# Patient Record
Sex: Male | Born: 1995 | Race: White | Hispanic: Yes | Marital: Single | Smoking: Never smoker
Health system: Southern US, Community
[De-identification: ages and names within clinical notes are randomized; demographics above are authoritative.]

## PROBLEM LIST (undated history)

## (undated) DIAGNOSIS — E049 Nontoxic goiter, unspecified: Secondary | ICD-10-CM

## (undated) DIAGNOSIS — N62 Hypertrophy of breast: Secondary | ICD-10-CM

## (undated) DIAGNOSIS — E063 Autoimmune thyroiditis: Secondary | ICD-10-CM

## (undated) DIAGNOSIS — R6250 Unspecified lack of expected normal physiological development in childhood: Secondary | ICD-10-CM

## (undated) DIAGNOSIS — E300 Delayed puberty: Secondary | ICD-10-CM

## (undated) DIAGNOSIS — R5600 Simple febrile convulsions: Secondary | ICD-10-CM

## (undated) HISTORY — DX: Nontoxic goiter, unspecified: E04.9

## (undated) HISTORY — DX: Simple febrile convulsions: R56.00

## (undated) HISTORY — PX: OTHER SURGICAL HISTORY: SHX169

## (undated) HISTORY — DX: Hypertrophy of breast: N62

## (undated) HISTORY — DX: Delayed puberty: E30.0

## (undated) HISTORY — DX: Autoimmune thyroiditis: E06.3

## (undated) HISTORY — DX: Unspecified lack of expected normal physiological development in childhood: R62.50

---
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
## 2015-02-03 ENCOUNTER — Other Ambulatory Visit: Payer: Self-pay | Admitting: "Endocrinology

## 2015-03-21 ENCOUNTER — Other Ambulatory Visit: Payer: Self-pay | Admitting: "Endocrinology

## 2015-05-06 ENCOUNTER — Ambulatory Visit (INDEPENDENT_AMBULATORY_CARE_PROVIDER_SITE_OTHER): Payer: Medicaid Other | Admitting: "Endocrinology

## 2015-05-06 ENCOUNTER — Encounter: Payer: Self-pay | Admitting: "Endocrinology

## 2015-05-06 VITALS — BP 106/64 | HR 60 | Wt 109.0 lb

## 2015-05-06 DIAGNOSIS — E049 Nontoxic goiter, unspecified: Secondary | ICD-10-CM | POA: Diagnosis not present

## 2015-05-06 DIAGNOSIS — R634 Abnormal weight loss: Secondary | ICD-10-CM

## 2015-05-06 DIAGNOSIS — E038 Other specified hypothyroidism: Secondary | ICD-10-CM | POA: Diagnosis not present

## 2015-05-06 DIAGNOSIS — E063 Autoimmune thyroiditis: Secondary | ICD-10-CM

## 2015-05-06 NOTE — Progress Notes (Signed)
Subjective:  Patient Name: [name] [name] Date of Birth: [birth year]  MRN: [id number]  [name] [name]  presents to the office today for follow-up evaluation and management of his acquired hypothyroidism, goiter, and thyroiditis.   HISTORY OF PRESENT ILLNESS:   [name] [name] is a 19 y.o. Hispanic young man.  [name] [name] was unaccompanied.  1. I saw the patient for his initial consultation on 02/26/10. He had been referred by his nurse practitioner at [facility], [name], for evaluation of growth delay, puberty delay, and gynecomastia. He was 14-5/40 years old.  A. The child had had febrile seizures at about 44 months of age and was treated for two years with some medications, presumably anti-epileptic drugs. He was subsequently noted to have short stature and was evaluated at the [facility] in 2009. His screening tests for growth hormone were reportedly normal. Bone age was reportedly read as 70 at a chronologic age of 12-3/12.  Child was supposed to FU at [facility] in 2010 but did not. Gynecomastia was first noted in 2010. FH was positive for the father having pubertal delay and continuing to grow at ages 75-18. Father was reportedly 72 inches tall. Mother was 60 inches tall. Maternal grandfather was tall, but maternal grandmother and both paternal grandparents were short.    B. On physical exam the child was at about the 6% for height and at about the 8% for weight, increased from the 2% and 1% respectively in 2009. He was short and slender, but proportionate. He had a 20+ gram goiter. Areolae were Tanner stage 1 in configuration, but 19-20 mm in diameter, which was somewhat enlarged. Pubic hair was Tanner III-IV, Testes were 16-20 mL in volume. IGF-1 was 203 (normal for age and increased from 50 in 2009). CMP was normal. TFTs were low-normal. 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  He also had a goiter  and low-normal TFTs. I elected to follow him serially over time.   2. During the past five years he has progressed further through puberty and has grown in both height and weight. His goiter size and TFTs have fluctuated over time. When his TFTs in July 2013 were hypothyroid, I started him on Synthroid, 25 mcg/day.   3. The patient's last [facility] visit was on 11/07/14. At that visit Ms. Maxwell Caul, our NP,discussed the possibilities of increasing his Synthroid dose over time if needed or stopping the Synthroid for a trial period. Ms. Maxwell Caul and Dougles decided to continue the Synthroid dose of 25 mcg/day. In the interim he has been healthy. He has been taking his Synthroid every day. He eats well. He gets some exercise going to the gym. He forgot to have lab work done before this visit.   4. Pertinent Review of Systems:  Constitutional: [name] [name] feels "okay". His energy level and stamina are good.  Eyes: Vision seems to be good. There are no recognized eye problems. Neck: The patient has no complaints of anterior neck swelling, soreness, tenderness, pressure, discomfort, or difficulty swallowing.   Heart: He feels that his heart beats faster at times when he exercises. The patient has no complaints of palpitations, irregular heart beats, chest pain, or chest pressure.   Gastrointestinal: Bowel movents seem normal. The patient has no complaints of excessive hunger, acid reflux, upset stomach, stomach aches or pains, diarrhea, or constipation.  Hands: No tremor Legs: Muscle mass and strength seem normal. There are no complaints  of numbness, tingling, burning, or pain. No edema is noted.  Feet: There are no obvious foot problems. There are no complaints of numbness, tingling, burning, or pain. No edema is noted. Neurologic: There are no recognized problems with muscle movement and strength, sensation, or coordination. He can do his video games and text quite well.  Psychologic: He is "very good". Mentally: He  does well in terms of paying attention, remembering, thinking, and making decisions.  PAST MEDICAL, FAMILY, AND SOCIAL HISTORY  Past Medical History  Diagnosis Date  . Febrile seizure   . Physical growth delay   . Gynecomastia, male   . Puberty delay   . Goiter   . Thyroiditis, autoimmune     Family History  Problem Relation Age of Onset  . Diabetes Paternal Grandfather   . Cancer Neg Hx   . Heart disease Neg Hx      Current outpatient prescriptions:  .  SYNTHROID 25 MCG tablet, TAKE ONE TABLET BY MOUTH IN THE MORNING, Disp: 30 tablet, Rfl: 0  Allergies as of 05/06/2015  . (No Known Allergies)     reports that he has never smoked. He has never used smokeless tobacco. He reports that he does not drink alcohol or use illicit drugs. Pediatric History  Patient Guardian Status  . Mother:  Chaudhary,[name]   Other Topics Concern  . Not on file   Social History Narrative    1. School and Family: He graduated from high school this past May. 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 2. Activities: He goes to the gym three days per week. He has health insurance through his parents.     3. Primary Care Provider: None  REVIEW OF SYSTEMS: There are no other significant problems involving [name] [name]'s other body systems.   Objective:  Vital Signs:  BP 106/64 mmHg  Pulse 60  Wt 109 lb (49.442 kg)   Ht Readings from Last 3 Encounters:  10/30/13 5' 4.45" (1.637 m) (4 %*, Z = -1.73)  05/02/13 5' 3.9" (1.623 m) (3 %*, Z = -1.86)  01/30/13 5' 4.09" (1.628 m) (4 %*, Z = -1.75)   * Growth percentiles are based on CDC 2-20 Years data.   Wt Readings from Last 3 Encounters:  05/06/15 109 lb (49.442 kg) (0 %*, Z = -2.59)  10/30/14 110 lb 3.2 oz (49.986 kg) (1 %*, Z = -2.43)  05/02/14 109 lb (49.442 kg) (1 %*, Z = -2.42)   * Growth percentiles are based on CDC 2-20 Years data.   There is no height on file to calculate BSA. No height on file for this encounter. 0%ile  (Z=-2.59) based on CDC 2-20 Years weight-for-age data using vitals from 05/06/2015.  PHYSICAL EXAM:  Constitutional: The patient appears healthy, trim, fit, and slender. His weight has decreased one pound during this very hot Summer. He is quiet but conversational when asked questions.  Head: The head is normocephalic. Face: The face appears normal. There are no obvious dysmorphic features. Eyes: There is no obvious arcus or proptosis. Moisture appears normal. Mouth: The oropharynx and tongue appear normal. Dentition appears to be normal for age. Oral moisture is normal. Neck: The neck appears to be visibly normal. No carotid bruits are noted. His strap muscles are much thicker, c/w weight lifting and physical work. The thyroid gland is enlarged today, about 22-23 grams in size. His left lobe is larger than the right. . The thyroid gland is not tender to palpation. Lungs:  The lungs are clear to auscultation. Air movement is good. Heart: Heart rate and rhythm are regular. Heart sounds S1 and S2 are normal. I did not appreciate any pathologic cardiac murmurs. Abdomen: The abdomen is normal in size. Bowel sounds are normal. There is no obvious hepatomegaly, splenomegaly, or other mass effect.  Arms: Muscle size and bulk are normal for age. Hands: There is no obvious tremor. Phalangeal and metacarpophalangeal joints are normal. Palmar muscles are normal for age. Palmar skin is normal. Palmar moisture is also normal. Legs: Muscles appear normal for age. No edema is present. Neurologic: Strength is normal for age in both the upper and lower extremities. Muscle tone is normal. Sensation to touch is normal in both legs.    LAB DATA:   Results for orders placed or performed in visit on 05/08/14  TSH  Result Value Ref Range   TSH 1.307 0.350 - 4.500 uIU/mL  T4, free  Result Value Ref Range   Free T4 1.28 0.80 - 1.80 ng/dL  T3, free  Result Value Ref Range   T3, Free 3.9 2.3 - 4.2 pg/mL   10/30/14:  TSH 1.307, free T4 1.28, free T3 3.9; 05/02/14: TSH 1.244, free T4 1.21, free T3 3.7 10/30/13: TSH 2.407, free T4 1.24, free T3 3.3; CBC normal; CMP normal 05/02/13: TSH 1.619, free T4 1.18, free T3 3.6 01/30/13: TSH 2.004, free T4 1.28, free T3 3.9 07/13/12: TSH 1.809, free T4 1.15, free T3 3.5, CMP normal, IGF-1 355, testosterone 286.28 03/29/12: TSH 5.393, free T4 0.98, free T3 3.3, IGF-1 265 01/11/12: TSH 3.81, free T4 0.94, free T3 3.8, TPO <10; LH 1.4, FSH 6.9, testosterone 198.57,    Assessment and Plan:   ASSESSMENT:  1. Goiter: His thyroid is more enlarged in size today. The process of waxing and waning of thyroid gland size is c/w evolving Hashimoto's thyroiditis.  2. Hypothyroidism, acquired, autoimmune: His TFTs in March were normal. :He has not had his labs done yet before this visit. We will assess whether he needs an increase in his Synthroid dose.   3. Hashimoto's thyroiditis: His thyroiditis is clinically quiescent today. However, the fact that all three of his TFTS increased in parallel from September 2015 to March 2016 indicates that he had had a flare up of thyroiditis during that time period. The shift of all three TFTs together, increasing or decreasing in parallel, is pathognomonic for an interval flare up of Hashimoto's disease.  4. 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    PLAN:  1. Diagnostic: TFTs today. Repeat TFTs in 6 months.  2. Therapeutic: Increase Synthroid when needed.  3. Patient education: Discussed the fact that he has episodic flare ups of thyroiditis and will likely need an increase in his Synthroid dose within the next 1-2 years. Discussed continuing Synthroid and needing to get labs today. He is happy and healthy and had no other concerns today.  4. Follow-up: 6 months   Level of Service: This visit lasted in excess of 35 minutes. More than 50% of the visit was  devoted to counseling.  David Stall MD, CDE

## 2015-05-06 NOTE — Patient Instructions (Signed)
Follow up visit in 6 months. Please repeat lab tests this week and again one week prior to next visit.  

## 2015-05-13 ENCOUNTER — Other Ambulatory Visit: Payer: Self-pay | Admitting: "Endocrinology

## 2015-07-17 ENCOUNTER — Other Ambulatory Visit: Payer: Self-pay | Admitting: "Endocrinology

## 2015-08-15 ENCOUNTER — Ambulatory Visit (INDEPENDENT_AMBULATORY_CARE_PROVIDER_SITE_OTHER): Payer: Self-pay | Admitting: Family Medicine

## 2015-08-15 ENCOUNTER — Ambulatory Visit (INDEPENDENT_AMBULATORY_CARE_PROVIDER_SITE_OTHER): Payer: Self-pay

## 2015-08-15 VITALS — BP 128/79 | HR 81 | Temp 97.7°F | Resp 16 | Ht 64.0 in | Wt 109.8 lb

## 2015-08-15 DIAGNOSIS — R079 Chest pain, unspecified: Secondary | ICD-10-CM

## 2015-08-15 DIAGNOSIS — T148XXA Other injury of unspecified body region, initial encounter: Secondary | ICD-10-CM

## 2015-08-15 NOTE — Progress Notes (Signed)
[facility] [address] [place] [address] [phone]  Date:  08/15/2015   Name:  [name] [name]   DOB:  23-Aug-1996   MRN:  [id number]  PCP:  No PCP Per Patient    Chief Complaint: Chest Pain   History of Present Illness:  This is a 19 y.o. male with PMH hypothyroidism who is presenting with chest pain x 5 days. States it develops as the day goes on and goes away usually by night time. CP is either located midsternal, left anterior chest or left lateral chest. Worse with movement. Sometimes hurts with breathing in cold air. He denies sob, dizziness, diaphoresis, vision change, headache. Sometimes gets fast heart beats, esp when he starts to get worried about the cp. He is not currently having any pain. He states this happened before 3 months ago but went away on own. He is unsure if he was seen for this issue ? He has no hx of acid reflux. No assoc between cp and eating. No hx anxiety. He states he does not feel stressed. He did take something for his pain which helped but he has no idea what he took. He has been taking synthroid daily. He is followed by endocrinology, last saw 05/2015. He has no hx heart disease. Only heart disease in his family with MGM - does not have any information otherwise. She was >50 when she started having problems with her heart.  He is a Education administrator.  Review of Systems:  Review of Systems See HPI  Patient Active Problem List   Diagnosis Date Noted  . Hypothyroidism, acquired, autoimmune 03/29/2012  . Weight loss 03/29/2012  . Goiter   . Thyroiditis, autoimmune     Prior to Admission medications   Medication Sig Start Date End Date Taking? Authorizing Provider  SYNTHROID 25 MCG tablet TAKE ONE TABLET BY MOUTH IN THE MORNING 07/17/15  Yes David Stall, MD    No Known Allergies  Past Surgical History  Procedure Laterality Date  . None      Social History  Substance Use Topics  . Smoking status: Never Smoker   .  Smokeless tobacco: Never Used  . Alcohol Use: No    Family History  Problem Relation Age of Onset  . Diabetes Paternal Grandfather   . Cancer Neg Hx   . Heart disease Neg Hx     Medication list has been reviewed and updated.  Physical Examination:  Physical Exam  Constitutional: He is oriented to person, place, and time. He appears well-developed and well-nourished. No distress.  HENT:  Head: Normocephalic and atraumatic.  Right Ear: Hearing, tympanic membrane, external ear and ear canal normal.  Left Ear: Hearing, tympanic membrane, external ear and ear canal normal.  Nose: Nose normal.  Mouth/Throat: Uvula is midline and mucous membranes are normal. Posterior oropharyngeal erythema present. No oropharyngeal exudate or posterior oropharyngeal edema.  Eyes: Conjunctivae, EOM and lids are normal. Pupils are equal, round, and reactive to light. Right eye exhibits no discharge. Left eye exhibits no discharge. No scleral icterus.  Neck: Trachea normal. Carotid bruit is not present. No thyromegaly present.  Cardiovascular: Normal rate, regular rhythm, normal heart sounds and normal pulses.   No murmur heard. Pulmonary/Chest: Effort normal and breath sounds normal. No respiratory distress. He has no wheezes. He has no rhonchi. He has no rales. He exhibits tenderness.  Tenderness mid-sternal No pain with rotation of trunk or flexion/extension of arms.  Abdominal: Soft. Normal appearance.  There is no tenderness.  No epigastric tenderness  Musculoskeletal: Normal range of motion.  Lymphadenopathy:       Head (right side): No submental, no submandibular and no tonsillar adenopathy present.       Head (left side): No submental, no submandibular and no tonsillar adenopathy present.    He has no cervical adenopathy.  Neurological: He is alert and oriented to person, place, and time.  Skin: Skin is warm, dry and intact. No lesion and no rash noted.  Psychiatric: His speech is normal and  behavior is normal. Thought content normal.  Appears anxious   BP 128/79 mmHg  Pulse 81  Temp(Src) 97.7 F (36.5 C) (Oral)  Resp 16  Ht 5\' 4"  (1.626 m)  Wt 109 lb 12.8 oz (49.805 kg)  BMI 18.84 kg/m2  SpO2 98%  EKG interpreted with Dr. Patsy Lageropland: sinus rhythm with possible right atrial enlargement  [facility] reading (PRIMARY) by  Dr. Patsy Lageropland: negative. IMPRESSION: No active disease.  CHEST - 2 VIEW  COMPARISON: None.  FINDINGS: Cardiac shadow is within normal limits. The lungs are well aerated bilaterally. No focal infiltrate, effusion or pneumothorax is noted. Mild scoliosis is seen in the thoracolumbar spine.  IMPRESSION: No active disease  Assessment and Plan:  1. Chest pain, unspecified chest pain type 2. Muscle strain EKG without worrisome findings. CXR nml. This is likely a muscle strain. He will take ibuprofen 600 mg TID x 3-4 days. Counseled on heat and activity modification. If pain is not subsiding in 1 week he will let me know and I will refer to cardiology. - EKG 12-Lead - DG Chest 2 View; Future  Discussed case with Dr. Patsy Lageropland.    Roswell MinersNicole V. Dyke BrackettBush, PA-C, MHS [facility]  08/15/2015   Agree with above- I was able to reproduce pts CP by pressing on the left costochondral area.  He also endorses that he has the pain when he moves or stretches his trunk in a certain way.  This certainly seems to be non- cardiac pain.  Cautioned pt to seek care again if his sx return

## 2015-08-15 NOTE — Patient Instructions (Signed)
You can take 3 ibuprofen every 8 hours for the next 3-4 days for your chest pain. Heat can help. Modify your activity for a few days if able. If you are still having problems in a week, let me know, and I will refer you to a heart doctor.  Muscle Strain A muscle strain is an injury that occurs when a muscle is stretched beyond its normal length. Usually a small number of muscle fibers are torn when this happens. Muscle strain is rated in degrees. First-degree strains have the least amount of muscle fiber tearing and pain. Second-degree and third-degree strains have increasingly more tearing and pain.  Usually, recovery from muscle strain takes 1-2 weeks. Complete healing takes 5-6 weeks.  CAUSES  Muscle strain happens when a sudden, violent force placed on a muscle stretches it too far. This may occur with lifting, sports, or a fall.  RISK FACTORS Muscle strain is especially common in athletes.  SIGNS AND SYMPTOMS At the site of the muscle strain, there may be:  Pain.  Bruising.  Swelling.  Difficulty using the muscle due to pain or lack of normal function. DIAGNOSIS  Your health care provider will perform a physical exam and ask about your medical history. TREATMENT  Often, the best treatment for a muscle strain is resting, icing, and applying cold compresses to the injured area.  HOME CARE INSTRUCTIONS   Use the PRICE method of treatment to promote muscle healing during the first 2-3 days after your injury. The PRICE method involves:  Protecting the muscle from being injured again.  Restricting your activity and resting the injured body part.  Icing your injury. To do this, put ice in a plastic bag. Place a towel between your skin and the bag. Then, apply the ice and leave it on from 15-20 minutes each hour. After the third day, switch to moist heat packs.  Apply compression to the injured area with a splint or elastic bandage. Be careful not to wrap it too tightly. This may  interfere with blood circulation or increase swelling.  Elevate the injured body part above the level of your heart as often as you can.  Only take over-the-counter or prescription medicines for pain, discomfort, or fever as directed by your health care provider.  Warming up prior to exercise helps to prevent future muscle strains. SEEK MEDICAL CARE IF:   You have increasing pain or swelling in the injured area.  You have numbness, tingling, or a significant loss of strength in the injured area. MAKE SURE YOU:   Understand these instructions.  Will watch your condition.  Will get help right away if you are not doing well or get worse.   This information is not intended to replace advice given to you by your health care provider. Make sure you discuss any questions you have with your health care provider.   Document Released: 08/16/2005 Document Revised: 06/06/2013 Document Reviewed: 03/15/2013 Elsevier Interactive Patient Education [employer]2016 Elsevier Inc.

## 2015-08-29 ENCOUNTER — Emergency Department (HOSPITAL_COMMUNITY)
Admission: EM | Admit: 2015-08-29 | Discharge: 2015-08-29 | Disposition: A | Discharge status: Home / Self Care | Payer: Medicaid Other | Attending: Emergency Medicine | Admitting: Emergency Medicine

## 2015-08-29 ENCOUNTER — Emergency Department (HOSPITAL_COMMUNITY): Payer: Medicaid Other

## 2015-08-29 ENCOUNTER — Encounter (HOSPITAL_COMMUNITY): Payer: Self-pay | Admitting: *Deleted

## 2015-08-29 DIAGNOSIS — Y990 Civilian activity done for income or pay: Secondary | ICD-10-CM | POA: Diagnosis not present

## 2015-08-29 DIAGNOSIS — E063 Autoimmune thyroiditis: Secondary | ICD-10-CM | POA: Insufficient documentation

## 2015-08-29 DIAGNOSIS — S29011A Strain of muscle and tendon of front wall of thorax, initial encounter: Secondary | ICD-10-CM

## 2015-08-29 DIAGNOSIS — Z87438 Personal history of other diseases of male genital organs: Secondary | ICD-10-CM | POA: Insufficient documentation

## 2015-08-29 DIAGNOSIS — Y9289 Other specified places as the place of occurrence of the external cause: Secondary | ICD-10-CM | POA: Diagnosis not present

## 2015-08-29 DIAGNOSIS — X58XXXA Exposure to other specified factors, initial encounter: Secondary | ICD-10-CM | POA: Diagnosis not present

## 2015-08-29 DIAGNOSIS — R079 Chest pain, unspecified: Secondary | ICD-10-CM | POA: Diagnosis present

## 2015-08-29 DIAGNOSIS — Y9389 Activity, other specified: Secondary | ICD-10-CM | POA: Diagnosis not present

## 2015-08-29 LAB — I-STAT TROPONIN, ED: Troponin i, poc: 0 ng/mL (ref 0.00–0.08)

## 2015-08-29 LAB — I-STAT CHEM 8, ED
BUN: 12 mg/dL (ref 6–20)
CALCIUM ION: 1.15 mmol/L (ref 1.12–1.23)
CHLORIDE: 103 mmol/L (ref 101–111)
CREATININE: 0.6 mg/dL — AB (ref 0.61–1.24)
GLUCOSE: 94 mg/dL (ref 65–99)
HCT: 51 % (ref 39.0–52.0)
Hemoglobin: 17.3 g/dL — ABNORMAL HIGH (ref 13.0–17.0)
POTASSIUM: 3.3 mmol/L — AB (ref 3.5–5.1)
Sodium: 142 mmol/L (ref 135–145)
TCO2: 26 mmol/L (ref 0–100)

## 2015-08-29 MED ORDER — IBUPROFEN 800 MG PO TABS
800.0000 mg | ORAL_TABLET | Freq: Once | ORAL | Status: AC
  Administered 2015-08-29: 800 mg via ORAL
  Filled 2015-08-29: qty 1

## 2015-08-29 NOTE — ED Provider Notes (Signed)
CSN: [id number]     Arrival date & time 08/29/15  1728 History   First MD Initiated Contact with Patient 08/29/15 2152     Chief Complaint  Patient presents with  . Chest Pain     (Consider location/radiation/quality/duration/timing/severity/associated sxs/prior Treatment) HPI   Blood pressure 127/71, pulse 78, temperature 98.8 F (37.1 C), resp. rate 19, height  (1.651 m), SpO2 100 %.  Reznor Ferrando is a 19 y.o. 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 He is right-hand-dominant. He states that the pain improves when he takes several days off of work but it is exacerbated after a day of painting. He rates his pain at 6 out of 10, he is taking an unknown pain medication at home with some relief. He denies family history of early cardiac death, cough, fever, chills, history of DVT or PE, recent immobilization, calf pain or leg swelling, history of cancer.   Past Medical History  Diagnosis Date  . Febrile seizure (HCC)   . Physical growth delay   . Gynecomastia, male   . Puberty delay   . Goiter   . Thyroiditis, autoimmune    Past Surgical History  Procedure Laterality Date  . None     Family History  Problem Relation Age of Onset  . Diabetes Paternal Grandfather   . Cancer Neg Hx   . Heart disease Neg Hx    Social History  Substance Use Topics  . Smoking status: Never Smoker   . Smokeless tobacco: Never Used  . Alcohol Use: No    Review of Systems  10 systems reviewed and found to be negative, except as noted in the HPI.   Allergies  Review of patient's allergies indicates no known allergies.  Home Medications   Prior to Admission medications   Medication Sig Start Date End Date Taking? Authorizing Provider  SYNTHROID 25 MCG tablet TAKE ONE TABLET BY MOUTH IN THE MORNING 07/17/15  Yes David Stall, MD   BP 127/71 mmHg  Pulse 78   Temp(Src) 98.8 F (37.1 C)  Resp 19  Ht  (1.651 m)  SpO2 100% Physical Exam  Constitutional: He is oriented to person, place, and time. He appears well-developed and well-nourished. No distress.  HENT:  Head: Normocephalic.  Mouth/Throat: Oropharynx is clear and moist.  Eyes: Conjunctivae and EOM are normal.  Neck: Normal range of motion. No JVD present. No tracheal deviation present.  Cardiovascular: Normal rate, regular rhythm and intact distal pulses.   Radial pulse equal bilaterally  Pulmonary/Chest: Effort normal and breath sounds normal. No stridor. No respiratory distress. He has no wheezes. He has no rales. He exhibits tenderness.  Abdominal: Soft. He exhibits no distension and no mass. There is no tenderness. There is no rebound and no guarding.  Musculoskeletal: Normal range of motion. He exhibits no edema or tenderness.  No calf asymmetry, superficial collaterals, palpable cords, edema, Homans sign negative bilaterally.    Neurological: He is alert and oriented to person, place, and time.  Skin: Skin is warm. He is not diaphoretic.  Psychiatric: He has a normal mood and affect.  Nursing note and vitals reviewed.   ED Course  Procedures (including critical care time) Labs Review Labs Reviewed  Rosezena Sensor, ED  I-STAT CHEM 8, ED    Imaging Review Dg Chest 2 View  08/29/2015  CLINICAL DATA:  19 year old male with chest  pain upon deep breathing EXAM: CHEST  2 VIEW COMPARISON:  Radiograph dated 08/15/2015 FINDINGS: The heart size and mediastinal contours are within normal limits. Both lungs are clear. The visualized skeletal structures are unremarkable. IMPRESSION: No active cardiopulmonary disease. Electronically Signed   By: Elgie CollardArash  Radparvar M.D.   On: 08/29/2015 18:23   I have personally reviewed and evaluated these images and lab results as part of my medical decision-making.   EKG Interpretation None      MDM   Final diagnoses:  Chest wall muscle  strain, initial encounter    Filed Vitals:   08/29/15 2207 08/29/15 2215 08/29/15 2230 08/29/15 2245  BP:  134/74 129/75 121/69  Pulse:  79 93 77  Temp:      Resp:  13 18 26   Height:      SpO2: 100% 100% 100% 100%    Medications  ibuprofen (ADVIL,MOTRIN) tablet 800 mg (800 mg Oral Given 08/29/15 2248)    Herb Grayssmael Kittle is 19 y.o. male presenting with reproducible chest pain, exacerbated by movement. 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 EKG with nonspecific changes, no prior for comparison. Troponin and i-STAT Chem-8 without significant abnormality. Discussed EKG with attending who agrees patient is stable for discharge, given outpatient follow-up to establish primary care.  Evaluation does not show pathology that would require ongoing emergent intervention or inpatient treatment. Pt is hemodynamically stable and mentating appropriately. Discussed findings and plan with patient/guardian, who agrees with care plan. All questions answered. Return precautions discussed and outpatient follow up given.       Wynetta Emeryicole Jeronimo Hellberg, PA-C 08/30/15 0011  Doug SouSam Jacubowitz, MD 08/30/15 [phone]

## 2015-08-29 NOTE — Discharge Instructions (Signed)
For pain control please take ibuprofen (also known as Motrin or Advil) 800mg (this is normally 4 over the counter pills) 3 times a day  for 5 days. Take with food to minimize stomach irritation. ° °Do not hesitate to return to the emergency room for any new, worsening or concerning symptoms. ° °Please obtain primary care using resource guide below. Let them know that you were seen in the emergency room and that they will need to obtain records for further outpatient management. ° ° ° °Emergency Department Resource Guide °1) Find a Doctor and Pay Out of Pocket °Although you won't have to find out who is covered by your insurance plan, it is a good idea to ask around and get recommendations. You will then need to call the office and see if the doctor you have chosen will accept you as a new patient and what types of options they offer for patients who are self-pay. Some doctors offer discounts or will set up payment plans for their patients who do not have insurance, but you will need to ask so you aren't surprised when you get to your appointment. ° °2) Contact Your Local Health Department °Not all health departments have doctors that can see patients for sick visits, but many do, so it is worth a call to see if yours does. If you don't know where your local health department is, you can check in your phone book. The CDC also has a tool to help you locate your state's health department, and many state websites also have listings of all of their local health departments. ° °3) Find a Walk-in Clinic °If your illness is not likely to be very severe or complicated, you may want to try a walk in clinic. These are popping up all over the country in pharmacies, drugstores, and shopping centers. They're usually staffed by nurse practitioners or physician assistants that have been trained to treat common illnesses and complaints. They're usually fairly quick and inexpensive. However, if you have serious medical issues or  chronic medical problems, these are probably not your best option. ° °No Primary Care Doctor: °- Call [facility] at  832-8000 - they can help you locate a primary care doctor that  accepts your insurance, provides certain services, etc. °- Physician Referral Service- 1-800-533-3463 ° °Chronic Pain Problems: °Organization         Address  Phone   Notes  °[facility]  (336) 297-2271 Patients need to be referred by their primary care doctor.  ° °Medication Assistance: °Organization         Address  Phone   Notes  °[place] [facility] [address] °[place], [place] [address] (336) 641-8030 --Must be a resident of [place] °-- Must have NO insurance coverage whatsoever (no Medicaid/ Medicare, etc.) °-- The pt. MUST have a primary care doctor that directs their care regularly and follows them in the community °  °[facility]  (866) 331-1348   °[employer]  (888) 892-1162   ° °Agencies that provide inexpensive medical care: °Organization         Address  Phone   Notes  °[facility]  (336) 832-8035   °[facility]    (336) 832-7272   °[facility] [address] °[place], [place] [address] (336) 832-4777   °[facility] [address], °[place] (336) 271-4999   °[facility]    (336) 373-0678   °  [facility]    (336) 272-1050   °[facility] ° [address], [place] Phone:  (336) 832-4444, Fax:  (336) 832-4440 Hours of Operation:  9 am - 6 pm, M-F.  Also accepts Medicaid/Medicare and self-pay.  °[facility] ° [address], [place] Phone: (336) 832-3150, Fax: (336) 832-3151. Hours of Operation:  8:30 am - 5:30 pm, M-F.  Also accepts Medicaid and self-pay.  °[facility] [place] [address], [place] Phone: (336) 878-6027   °[facility] [address], [place], [place]  (336)723-1848, Ext. 123 Mondays & Thursdays: 7-9 AM.  First 15 patients are seen on a first come, first serve basis. °  ° °Medicaid-accepting [place] Providers: ° °Organization         Address  Phone   Notes  °[facility] [address], [place] (336) 641-2100 Also accepts self-pay patients.  °[facility] [address], [place] ° (336) 856-9996   °[facility] [address], [place] (336) 288-8857   °[facility] [address] [place] [address], [place] (336) 299-7000   °Veita Bland [address], [place]  ° (336) 373-1557 Only accepts [place] Access Medicaid patients after they have their name applied to their card.  ° °Self-Pay (no insurance) in [place]: ° °Organization         Address  Phone   Notes  °Sickle Cell Patients, [facility] [address], [place] (336) 832-1970   °[facility] [address], [place] (336) 832-4400   °[facility] ° [address], [place] (336) 992-4800   °[facility]/Dr. Osei-Bonsu ° [address] [place] [address], [place] or [address], [place] (336) 841-8500 Phone number for both [place] and [place] locations is the same.  °[facility] [address], [place] (336) 299-0000   °[facility] [address] [place] [address], [place] or [address] (336) 852-7530 °(336) 878-2260   °[facility] [address], [place] (336) 350-1642, phone; (336) 294-5005, fax Sees patients 1st and 3rd Saturday of every month.  Must not qualify for public or private insurance (i.e. Medicaid, Medicare, [place] Health Choice, Veterans' Benefits) • Household income should be no more than 200% of the poverty level •The clinic cannot treat you if you are pregnant or think you are pregnant • Sexually transmitted  diseases are not treated at the clinic.  ° ° °Dental Care: °Organization         Address  Phone  Notes  °[facility] [address], [place] (336) 641-6152 Accepts children up to age 21 who are enrolled in Medicaid or [place] Health Choice; pregnant women with a Medicaid card; and children who have applied for Medicaid or [place] Health Choice, but were declined, whose parents can pay a reduced fee at time of service.  °[facility] [place]  [address], [place] (336) 641-7733 Accepts children up to age 21 who are enrolled in Medicaid or [place] Health Choice; pregnant women with a Medicaid card; and children who have applied for Medicaid or [place] Health Choice, but were declined, whose parents can pay a reduced fee at time   of service.  °[facility] ° [address], [place] (336) 641-4533 Patients are seen by appointment only. Walk-ins are not accepted. [facility] will see patients 18 years of age and older. °Monday - Tuesday (8am-5pm) °Most Wednesdays (8:30-5pm) °$30 per visit, cash only  °[facility] ° [address], [place] (336) 641-4533 Patients are seen by appointment only. Walk-ins are not accepted. [facility] will see patients 18 years of age and older. °One Wednesday Evening (Monthly: Volunteer Based).  $30 per visit, cash only  °[facility]  (919) 537-3737 for adults; Children under age 4, call Graduate Pediatric Dentistry at (919) 537-3956. Children aged 4-14, please call (919) 537-3737 to request a pediatric application. ° Dental services are provided in all areas of dental care including fillings, crowns and bridges, complete and partial dentures, implants, gum treatment, root canals, and extractions. Preventive care is also provided. Treatment is provided to both adults and children. °Patients are selected via a  lottery and there is often a waiting list. °  °[facility] [address], °[place] ° (336) 763-8833 [email or web address].[email or web address].[email or web address] °  °[facility] [address], [place], [place] (336)723-1848, Ext. 123 Second and Fourth Thursday of each month, opens at 6:30 AM; Clinic ends at 9 AM.  Patients are seen on a first-come first-served basis, and a limited number are seen during each clinic.  ° °[facility] ° [address], [place], [place] (336) 723-7904   Eligibility Requirements °You must have lived in [place], [place], or [place] counties for at least the last three months. °  You cannot be eligible for state or federal sponsored healthcare insurance, including Veterans Administration, Medicaid, or Medicare. °  You generally cannot be eligible for healthcare insurance through your employer.  °  How to apply: °Eligibility screenings are held every Tuesday and Wednesday afternoon from 1:00 pm until 4:00 pm. You do not need an appointment for the interview!  °[facility] [address], [place], [place] 336-631-2330   °[facility]  336-342-8273   °[facility]  336-703-3100   °[facility]  336-570-6415   ° °Behavioral Health Resources in the Community: °Intensive Outpatient Programs °Organization         Address  Phone  Notes  °[facility] [address], [place], [place] 336-878-6098   °[facility] [address], [place], [place] 336-832-9800   °[facility] [address], [place], [place] ° 336-882-2125   °[facility] [address],  °[place],  1-800-853-5163 or 336-641-4981   °Substance Abuse Resources °Organization         Address  Phone  Notes  °[facility]  336-882-2125   °[facility]  336-784-9470   °[facility]  336-285-9073   °[facility]  336-845-3988   °Residential &  Outpatient Substance Abuse Program  1-800-659-3381   °Psychological Services °Organization         Address  Phone  Notes  °[facility]  336- 832-9600   °[facility]  336- 378-7881   °[facility] [address], [place] 1-800-853-5163 or 336-641-4981   ° °Mobile Crisis Teams °Organization         Address  Phone  Notes  °[facility], Mobile Crisis Care Unit  1-877-626-1772   °Assertive °Psychotherapeutic   Services ° [address] [place], [place] 336-834-9664   °Sharon DeEsch [address] °[place] 336-554-5454   ° °Self-Help/Support Groups °Organization         Address  Phone             Notes  °[facility] [place] - variety of support groups  336- 373-1402 Call for more information  °Narcotics Anonymous (NA), [facility] [address], °[place]  2 meetings at this location  ° °Residential Treatment Programs °Organization         Address  Phone  Notes  °[facility] [address],    °[place]  1-866-801-8205   °[facility] ° [address], [place], [place] 704-293-8524   °[facility] [address], [place] 336-845-3988 Admissions: 8am-3pm M-F  °[facility] [address]    °[place], [place] 336-841-1104   °[facility] [address], [place], [place] 336-379-7146   °[facility] [address]  °[place], [place] 336-285-9073   °[facility] - Intensive Outpatient [address], [place], [place] 336-852-3033   °[facility] [address]  °[place], [place] 1-877-615-2722 or 336-784-9470   °[facility] [address] [place], [place] 336-227-7417 Accepts Medicaid  °[facility] [address]  °[place] 1-800-659-3381 Substance Abuse/Addiction Treatment  ° °[place] Behavioral Health Resources °Organization          Address  Phone  Notes  °[facility]  (888) 581-9988   °Julie Brannon, PhD [address] [place], [place]   (336) 349-5553 or (336) 951-0000   °[facility]   [address] °[place], [place] (336) 349-4454   °[facility] [address], [place], [place] (336) 342-8316 Insurance/Medicaid/sponsorship through [facility]  °[facility] [address]                                    [place],  (336) 342-8316 Therapy/tele-psych/case  °[facility] [address]  ° [place],  (336) 349-2233    °Dr. Arfeen  (336) 349-4544   °[facility] [place]  [employer] [place] [facility] 1) [address], [place] °2) [address], [place] °3)  [address], [place] (336) 349-3220 °(336) 342-7768 ° °(336) 342-8140   °[place] [facility] (336) 342-1394 or (336) 342-3537 (After Hours)    ° ° ° °

## 2015-08-29 NOTE — ED Notes (Signed)
The pt has had some lt chest pain for 2 weeks just whenever he takes a deep breath.  He has had a cold  In the past 2 weeks  No cough.  No previous history.

## 2015-09-10 ENCOUNTER — Other Ambulatory Visit: Payer: Self-pay | Admitting: "Endocrinology

## 2015-10-31 ENCOUNTER — Other Ambulatory Visit: Payer: Self-pay | Admitting: "Endocrinology

## 2015-11-03 ENCOUNTER — Ambulatory Visit: Payer: Medicaid Other | Admitting: "Endocrinology

## 2015-11-04 ENCOUNTER — Ambulatory Visit: Payer: Medicaid Other | Admitting: "Endocrinology

## 2015-11-14 ENCOUNTER — Emergency Department (HOSPITAL_COMMUNITY)
Admission: EM | Admit: 2015-11-14 | Discharge: 2015-11-15 | Disposition: A | Discharge status: Home / Self Care | Payer: Medicaid Other | Attending: Emergency Medicine | Admitting: Emergency Medicine

## 2015-11-14 ENCOUNTER — Encounter (HOSPITAL_COMMUNITY): Payer: Self-pay | Admitting: Emergency Medicine

## 2015-11-14 ENCOUNTER — Emergency Department (HOSPITAL_COMMUNITY): Payer: Medicaid Other

## 2015-11-14 DIAGNOSIS — R0600 Dyspnea, unspecified: Secondary | ICD-10-CM | POA: Diagnosis not present

## 2015-11-14 DIAGNOSIS — E063 Autoimmune thyroiditis: Secondary | ICD-10-CM | POA: Insufficient documentation

## 2015-11-14 DIAGNOSIS — R002 Palpitations: Secondary | ICD-10-CM | POA: Diagnosis not present

## 2015-11-14 DIAGNOSIS — E049 Nontoxic goiter, unspecified: Secondary | ICD-10-CM | POA: Insufficient documentation

## 2015-11-14 DIAGNOSIS — R1310 Dysphagia, unspecified: Secondary | ICD-10-CM | POA: Insufficient documentation

## 2015-11-14 DIAGNOSIS — F419 Anxiety disorder, unspecified: Secondary | ICD-10-CM | POA: Diagnosis not present

## 2015-11-14 DIAGNOSIS — Z87438 Personal history of other diseases of male genital organs: Secondary | ICD-10-CM | POA: Diagnosis not present

## 2015-11-14 DIAGNOSIS — R0602 Shortness of breath: Secondary | ICD-10-CM | POA: Diagnosis present

## 2015-11-14 LAB — CBC
HEMATOCRIT: 45.1 % (ref 39.0–52.0)
HEMOGLOBIN: 15.5 g/dL (ref 13.0–17.0)
MCH: 29.6 pg (ref 26.0–34.0)
MCHC: 34.4 g/dL (ref 30.0–36.0)
MCV: 86.1 fL (ref 78.0–100.0)
PLATELETS: 266 10*3/uL (ref 150–400)
RBC: 5.24 MIL/uL (ref 4.22–5.81)
RDW: 12.6 % (ref 11.5–15.5)
WBC: 7.9 10*3/uL (ref 4.0–10.5)

## 2015-11-14 LAB — BASIC METABOLIC PANEL
ANION GAP: 12 (ref 5–15)
BUN: 11 mg/dL (ref 6–20)
CHLORIDE: 102 mmol/L (ref 101–111)
CO2: 26 mmol/L (ref 22–32)
Calcium: 9.7 mg/dL (ref 8.9–10.3)
Creatinine, Ser: 0.88 mg/dL (ref 0.61–1.24)
GFR calc Af Amer: >60 mL/min (ref >60)
GLUCOSE: 106 mg/dL — AB (ref 65–99)
POTASSIUM: 3.4 mmol/L — AB (ref 3.5–5.1)
Sodium: 140 mmol/L (ref 135–145)

## 2015-11-14 LAB — I-STAT TROPONIN, ED: Troponin i, poc: 0 ng/mL (ref 0.00–0.08)

## 2015-11-14 NOTE — ED Notes (Signed)
C/o sob and heart racing that lasted a few seconds while watching tv tonight.  Pt reports thyroid issues and states he normally has sob when he doesn't take his Synthroid regularly but states he has been taking it.

## 2015-11-15 NOTE — ED Notes (Signed)
Pt left with all his belongings and ambulated out of the treatment area.  

## 2015-11-15 NOTE — ED Provider Notes (Signed)
CSN: [id number]     Arrival date & time 11/14/15  2209 History  By signing my name below, I, Doreatha MartinEva Mathews, attest that this documentation has been prepared under the direction and in the presence of Zadie Rhineonald Jamel Dunton, MD. Electronically Signed: Doreatha MartinEva Mathews, ED Scribe. 11/15/2015. 2:32 AM.    Chief Complaint  Patient presents with  . Shortness of Breath  . Tachycardia    The history is provided by the patient. No language interpreter was used.   HPI Comments: [name] [name] is a 20 y.o. male with h/o thyroiditis on synthroid who presents to the Emergency Department complaining of an episode of moderate SOB and palpitations onset this evening while watching TV at 10PM and lasting an hour (told nurse it lasted seconds). Pt also describes transient difficulty swallowing d/t a sensation that this throat was closing. Per pt, he felt anxious during this episode. He states that he drank some water with no relief of symptoms. No h/o of similar symptoms. Pt states that he took his synthroid this evening as prescribed. He denies drug or alcohol use. No h/o PE/DVT, MI, CHF, CAD, Afib. No recent travel. Pt is a non-smoker. He denies syncope, CP, abdominal pain, back pain, HA, leg swelling, emesis, diarrhea, sore throat.   Past Medical History  Diagnosis Date  . Febrile seizure (HCC)   . Physical growth delay   . Gynecomastia, male   . Puberty delay   . Goiter   . Thyroiditis, autoimmune    Past Surgical History  Procedure Laterality Date  . None     Family History  Problem Relation Age of Onset  . Diabetes Paternal Grandfather   . Cancer Neg Hx   . Heart disease Neg Hx    Social History  Substance Use Topics  . Smoking status: Never Smoker   . Smokeless tobacco: Never Used  . Alcohol Use: No    Review of Systems  HENT: Positive for trouble swallowing. Negative for sore throat.   Respiratory: Positive for shortness of breath.   Cardiovascular: Positive for palpitations. Negative for chest  pain and leg swelling.  Gastrointestinal: Negative for vomiting, abdominal pain and diarrhea.  Musculoskeletal: Negative for back pain.  Neurological: Negative for syncope and headaches.  Psychiatric/Behavioral: The patient is nervous/anxious.   All other systems reviewed and are negative.  Allergies  Review of patient's allergies indicates no known allergies.  Home Medications   Prior to Admission medications   Medication Sig Start Date End Date Taking? Authorizing Provider  SYNTHROID 25 MCG tablet TAKE ONE TABLET BY MOUTH IN THE MORNING 11/03/15  Yes David StallMichael J Brennan, MD   BP 122/71 mmHg  Pulse 65  Temp(Src) 98 F (36.7 C) (Oral)  Resp 16  Ht 5\' 5"  (1.651 m)  Wt 109 lb (49.442 kg)  BMI 18.14 kg/m2  SpO2 99% Physical Exam CONSTITUTIONAL: Well developed/well nourished HEAD: Normocephalic/atraumatic EYES: EOMI/PERRL ENMT: Mucous membranes moist NECK: supple no meningeal signs SPINE/BACK:entire spine nontender CV: S1/S2 noted, no murmurs/rubs/gallops noted LUNGS: Lungs are clear to auscultation bilaterally, no apparent distress ABDOMEN: soft, nontender, no rebound or guarding, bowel sounds noted throughout abdomen GU:no cva tenderness NEURO: Pt is awake/alert/appropriate, moves all extremitiesx4.  No facial droop.   EXTREMITIES: pulses normal/equal, full ROM. No calf tenderness or edema noted.  SKIN: warm, color normal PSYCH: no abnormalities of mood noted, alert and oriented to situation   ED Course  Procedures  DIAGNOSTIC STUDIES: Oxygen Saturation is 99% on RA, normal by my interpretation.  COORDINATION OF CARE: 2:31 AM Discussed treatment plan with pt at bedside which includes CXR, lab work, EKG and pt agreed to plan.  on my evaluation, pt resting comfortably, no distress He denied any CP He feels likely due to his anxiety and all symptoms now resolved He would like to be discharged Advised could be related to his synthroid use advised f/u with endocrinology No  hypoxia No significant tachycardia I doubt PE or other acute cardiovascular/pulmonary emergency  Labs Review Labs Reviewed  BASIC METABOLIC PANEL - Abnormal; Notable for the following:    Potassium 3.4 (*)    Glucose, Bld 106 (*)    All other components within normal limits  CBC  I-STAT TROPOININ, ED    Imaging Review Dg Chest 2 View  11/14/2015  CLINICAL DATA:  Acute onset of shortness of breath, tachycardia and sore throat. Initial encounter. EXAM: CHEST  2 VIEW COMPARISON:  Chest radiograph performed 08/29/2015 FINDINGS: The lungs are well-aerated. Mild peribronchial thickening is noted. There is no evidence of focal opacification, pleural effusion or pneumothorax. The heart is normal in size; the mediastinal contour is within normal limits. No acute osseous abnormalities are seen. IMPRESSION: Mild peribronchial thickening noted.  Lungs otherwise clear. Electronically Signed   By: Roanna Raider M.D.   On: 11/14/2015 23:20   I have personally reviewed and evaluated these images and lab results as part of my medical decision-making.   EKG Interpretation   Date/Time:  Friday November 14 2015 22:18:56 EDT Ventricular Rate:  109 PR Interval:  142 QRS Duration: 86 QT Interval:  334 QTC Calculation: 449 R Axis:   106 Text Interpretation:  Sinus tachycardia Right atrial enlargement Rightward  axis Pulmonary disease pattern Abnormal ECG Confirmed by Bebe Shaggy  MD,  Dorinda Hill ([id number]) on 11/15/2015 12:09:03 AM      MDM   Final diagnoses:  Palpitations  Dyspnea    Nursing notes including past medical history and social history reviewed and considered in documentation xrays/imaging reviewed by myself and considered during evaluation Labs/vital reviewed myself and considered during evaluation    I personally performed the services described in this documentation, which was scribed in my presence. The recorded information has been reviewed and is accurate.       Zadie Rhine,  MD 11/15/15 [phone]

## 2015-11-25 ENCOUNTER — Encounter: Payer: Self-pay | Admitting: "Endocrinology

## 2015-11-25 ENCOUNTER — Ambulatory Visit (INDEPENDENT_AMBULATORY_CARE_PROVIDER_SITE_OTHER): Payer: Medicaid Other | Admitting: "Endocrinology

## 2015-11-25 VITALS — BP 126/66 | HR 77 | Wt 109.2 lb

## 2015-11-25 DIAGNOSIS — E038 Other specified hypothyroidism: Secondary | ICD-10-CM | POA: Diagnosis not present

## 2015-11-25 DIAGNOSIS — R000 Tachycardia, unspecified: Secondary | ICD-10-CM

## 2015-11-25 DIAGNOSIS — E063 Autoimmune thyroiditis: Secondary | ICD-10-CM | POA: Diagnosis not present

## 2015-11-25 DIAGNOSIS — I4711 Inappropriate sinus tachycardia, so stated: Secondary | ICD-10-CM

## 2015-11-25 DIAGNOSIS — E049 Nontoxic goiter, unspecified: Secondary | ICD-10-CM

## 2015-11-25 DIAGNOSIS — R9431 Abnormal electrocardiogram [ECG] [EKG]: Secondary | ICD-10-CM

## 2015-11-25 NOTE — Patient Instructions (Signed)
1.Follow up visit with me in 6 months. Please repeat lab tests one week prior.  2. You have an appointment with Dr[facility] Jomarie Longsroituru, [facility], [address] in [place], across from the [employer] in [place]. The appointment is for 10 AM, but please arrive at 9:30 AM for processing.

## 2015-11-25 NOTE — Progress Notes (Signed)
Subjective:  Patient Name: [name] [name] Date of Birth: [birth year]  MRN: [id number]  [name] [name]  [name] to the office today for follow-up evaluation and management of his acquired hypothyroidism, goiter, and thyroiditis.   HISTORY OF PRESENT ILLNESS:   [name] [name] is a 20 y.o. Hispanic young man.  [name] [name] was unaccompanied.  1. I saw the patient for his initial consultation on 02/26/10. He had been referred by his nurse practitioner at [facility], Ms. Melanie CrazierMinda Kramer, for evaluation of growth delay, puberty delay, and gynecomastia. He was 14-5/67 years old.  A. The child had had febrile seizures at about 5818 months of age and was treated for two years with some medications, presumably anti-epileptic drugs. He was subsequently noted to have short stature and was [facility] in [facility]. His screening tests for growth hormone were reportedly normal. Bone age was reportedly read as 4011 at a chronologic age of 12-3/12.  Child [facility] but did not. Gynecomastia was first noted in 2010. FH was positive for the father having pubertal delay and continuing to grow at ages 317-18. Father was reportedly 72 inches tall. Mother was 60 inches tall. Maternal grandfather was tall, but maternal grandmother and both paternal grandparents were short.    B. On physical exam the child was at about the 6% for height and at about the 8% for weight, increased from the 2% and 1% respectively in 2009. He was short and slender, but proportionate. He had a 20+ gram goiter. Areolae were Tanner stage 1 in configuration, but 19-20 mm in diameter, which was somewhat enlarged. Pubic hair was Tanner III-IV, Testes were 16-20 mL in volume. IGF-1 was 203 (normal for age and increased from 71195 in 2009). CMP was normal. TFTs were low-normal. 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  He also had a goiter  and low-normal TFTs. I elected to follow him serially over time.   2. During the past six years he has progressed further through puberty and has grown in both height and weight. His goiter size and TFTs have fluctuated over time. When his TFTs in July 2013 were hypothyroid, I started him on Synthroid, 25 mcg/day.   3. The patient's last [facility] visit was on 05/06/15. At that visit we continued his Synthroid dose of 25 mcg/day.   A. In the interim he had been healthy until 11/14/15 when he went to the ED because of palpitations and difficulty breathing.  His ECG upon presentation showed a sinus tachycardia of 109. The ECG also showed some right atrial enlargement and rightward axis, c/w pulmonary disease pattern. He was not having any asthma problems. He was not taking in much caffeine. After his heart rate slowed spontaneously to 65, he felt fine and requested to be discharged.   B. He has been taking his Synthroid every day.   C. He eats well. 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 He forgot to have lab work done before this visit.   4. Pertinent Review of Systems:  Constitutional: [name] [name] feels "great". His energy level and stamina are good.  Eyes: Vision seems to be good. There are no recognized eye problems. Neck: The patient has no complaints of anterior neck swelling, soreness, tenderness, pressure, discomfort, or difficulty swallowing.   Heart: He feels that his heart beats faster at times when he exercises.  The patient has no other complaints of palpitations, irregular heart beats, chest pain, or chest pressure.   Gastrointestinal: Bowel movents seem normal. The patient has no complaints of excessive hunger, acid reflux, upset stomach, stomach aches or pains, diarrhea, or constipation.  Hands: No tremor Legs: Muscle mass and strength seem normal. There are no complaints of numbness, tingling, burning, or pain. No edema is noted.  Feet: There are no obvious foot  problems. There are no complaints of numbness, tingling, burning, or pain. No edema is noted. Neurologic: There are no recognized problems with muscle movement and strength, sensation, or coordination. He can do his video games and text quite well.  Psychologic: He is "very good". Mentally: He does well in terms of paying attention, remembering, thinking, and making decisions.  PAST MEDICAL, FAMILY, AND SOCIAL HISTORY  Past Medical History  Diagnosis Date  . Febrile seizure (HCC)   . Physical growth delay   . Gynecomastia, male   . Puberty delay   . Goiter   . Thyroiditis, autoimmune     Family History  Problem Relation Age of Onset  . Diabetes Paternal Grandfather   . Cancer Neg Hx   . Heart disease Neg Hx      Current outpatient prescriptions:  .  SYNTHROID 25 MCG tablet, TAKE ONE TABLET BY MOUTH IN THE MORNING, Disp: 30 tablet, Rfl: 0  Allergies as of 11/25/2015  . (No Known Allergies)     reports that he has never smoked. He has never used smokeless tobacco. He reports that he does not drink alcohol or use illicit drugs. Pediatric History  Patient Guardian Status  . Mother:  [name],[name]   Other Topics Concern  . Not on file   Social History Narrative    1. School and Family: He graduated from high school in 2016. 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 2. Activities: He has [place] Medicaid.      3. Primary Care Provider: None  REVIEW OF SYSTEMS: There are no other significant problems involving [name] [name]'s other body systems.   Objective:  Vital Signs:  BP 126/66 mmHg  Pulse 77  Wt 109 lb 3.2 oz (49.533 kg)   Ht Readings from Last 3 Encounters:  11/15/15  (1.651 m)  08/29/15  (1.651 m) (5 %*, Z = -1.64)  08/15/15  (1.626 m) (2 %*, Z = -1.99)   * Growth percentiles are based on CDC 2-20 Years data.   Wt Readings from Last 3 Encounters:  11/25/15 109 lb 3.2 oz (49.533 kg)  11/15/15 109 lb (49.442 kg)  08/15/15 109 lb 12.8 oz  (49.805 kg) (1 %*, Z = -2.55)   * Growth percentiles are based on CDC 2-20 Years data.   There is no height on file to calculate BSA. Facility age limit for growth percentiles is 20 years. Facility age limit for growth percentiles is 20 years.  PHYSICAL EXAM:  Constitutional: The patient appears healthy, trim, fit, and slender. His weight has been stable. He is quiet and shy, but converses easily during our discussions. His affect and insight appear to be normal.  Head: The head is normocephalic. Face: The face appears normal. There are no obvious dysmorphic features. Eyes: There is no obvious arcus or proptosis. Moisture appears normal. Mouth: The oropharynx and tongue appear normal. Dentition appears to be normal for age. Oral moisture is normal. Neck: The neck appears to be visibly normal. No carotid bruits are noted. His strap muscles  are much thicker, c/w weight lifting and physical work. The thyroid gland is enlarged today, about 22-23 grams in size. The right lobe is at the upper limit of normal for size. His left lobe is larger than the right. The thyroid gland is not tender to palpation. Lungs: The lungs are clear to auscultation. Air movement is good. Heart: Heart rate and rhythm are regular. Heart sounds S1 and S2 are normal. I did not appreciate any pathologic cardiac murmurs. Abdomen: The abdomen is normal in size. Bowel sounds are normal. There is no obvious hepatomegaly, splenomegaly, or other mass effect.  Arms: Muscle size and bulk are normal for age. Hands: There is no obvious tremor. Phalangeal and metacarpophalangeal joints are normal. Palmar muscles are normal for age. Palmar skin is normal. Palmar moisture is also normal. Legs: Muscles appear normal for age. No edema is present. Neurologic: Strength is normal for age in both the upper and lower extremities. Muscle tone is normal. Sensation to touch is normal in both legs.    LAB DATA:   Results for orders placed or  performed during the hospital encounter of 11/14/15  Basic metabolic panel  Result Value Ref Range   Sodium 140 135 - 145 mmol/L   Potassium 3.4 (L) 3.5 - 5.1 mmol/L   Chloride 102 101 - 111 mmol/L   CO2 26 22 - 32 mmol/L   Glucose, Bld 106 (H) 65 - 99 mg/dL   BUN 11 6 - 20 mg/dL   Creatinine, Ser 1.61 0.61 - 1.24 mg/dL   Calcium 9.7 8.9 - 09.6 mg/dL   GFR calc non Af Amer >60 >60 mL/min   GFR calc Af Amer >60 >60 mL/min   Anion gap 12 5 - 15  CBC  Result Value Ref Range   WBC 7.9 4.0 - 10.5 K/uL   RBC 5.24 4.22 - 5.81 MIL/uL   Hemoglobin 15.5 13.0 - 17.0 g/dL   HCT 04.5 40.9 - 81.1 %   MCV 86.1 78.0 - 100.0 fL   MCH 29.6 26.0 - 34.0 pg   MCHC 34.4 30.0 - 36.0 g/dL   RDW 91.4 78.2 - 95.6 %   Platelets 266 150 - 400 K/uL  I-stat troponin, ED (not at [facility])  Result Value Ref Range   Troponin i, poc 0.00 0.00 - 0.08 ng/mL   Comment 3           11/14/15: Sodium 140, potassium 3.4, chloride 102, CO2 26, glucose 106  10/30/14: TSH 1.307, free T4 1.28, free T3 3.9;  05/02/14: TSH 1.244, free T4 1.21, free T3 3.7  10/30/13: TSH 2.407, free T4 1.24, free T3 3.3; CBC normal; CMP normal  05/02/13: TSH 1.619, free T4 1.18, free T3 3.6  01/30/13: TSH 2.004, free T4 1.28, free T3 3.9  07/13/12: TSH 1.809, free T4 1.15, free T3 3.5, CMP normal, IGF-1 355, testosterone 286.28  03/29/12: TSH 5.393, free T4 0.98, free T3 3.3, IGF-1 265  01/11/12: TSH 3.81, free T4 0.94, free T3 3.8, TPO <10; LH 1.4, FSH 6.9, testosterone 198.57,    Assessment and Plan:   ASSESSMENT:  1. Goiter: His thyroid is again enlarged at about the same size as at last visit. His right lobe is smaller. His left lobe may be a bit larger. The process of waxing and waning of thyroid gland size is c/w evolving Hashimoto's thyroiditis.  2. Hypothyroidism, acquired, autoimmune: His TFTs in March 2016 were normal. He has not had his labs done yet before  this visit. We will assess whether he needs an increase in his  Synthroid dose.   3. Hashimoto's thyroiditis: His thyroiditis is clinically quiescent today. However, the fact that all three of his TFTS increased in parallel from September 2015 to March 2016 indicates that he had had a flare up of thyroiditis during that time period. The shift of all three TFTs together, increasing or decreasing in parallel, is pathognomonic for an interval flare up of Hashimoto's disease.  4. Weight loss, unintentional: Resolved 5-6. Sinus tachycardia and abnormal ECG:  Given the abnormal character of his ECG, it makes sense to have him seen by cardiology.   PLAN:  1. Diagnostic: TFTs today. Repeat TFTs in 6 months. Cardiology appointment with Dr. Jomarie Longs on Wednesday, April 5th 10 AM at [address]. Patient should arrive at 9:30 AM.  2. Therapeutic: Increase Synthroid when needed.  3. Patient education: Discussed the fact that he has episodic flare ups of thyroiditis and will likely need an increase in his Synthroid dose within the next 1-2 years. Discussed continuing Synthroid and needing to get labs today. Discussed his recent tachycardia and abnormal ECG.   4. Follow-up: 6 months   Level of Service: This visit lasted in excess of 45 minutes. More than 50% of the visit was devoted to counseling.  David Stall MD, CDE

## 2015-11-26 LAB — T3, FREE: T3, Free: 3.4 pg/mL (ref 3.0–4.7)

## 2015-11-26 LAB — T4, FREE: Free T4: 1.2 ng/dL (ref 0.8–1.4)

## 2015-11-26 LAB — TSH: TSH: 1.21 mIU/L (ref 0.40–4.50)

## 2015-11-28 ENCOUNTER — Encounter: Payer: Self-pay | Admitting: *Deleted

## 2015-12-03 ENCOUNTER — Encounter: Payer: Self-pay | Admitting: Cardiovascular Disease

## 2015-12-03 ENCOUNTER — Ambulatory Visit (INDEPENDENT_AMBULATORY_CARE_PROVIDER_SITE_OTHER): Payer: Medicaid Other | Admitting: Cardiovascular Disease

## 2015-12-03 ENCOUNTER — Ambulatory Visit (INDEPENDENT_AMBULATORY_CARE_PROVIDER_SITE_OTHER): Payer: Medicaid Other

## 2015-12-03 DIAGNOSIS — E039 Hypothyroidism, unspecified: Secondary | ICD-10-CM

## 2015-12-03 DIAGNOSIS — R002 Palpitations: Secondary | ICD-10-CM | POA: Diagnosis not present

## 2015-12-03 NOTE — Progress Notes (Signed)
Patient ID: [name] [name], male   DOB: [birth year], 20 y.o.   MRN: [id number]     Cardiology Office Note    Date:  12/03/2015   ID:  [name] [name], DOB 1996/06/13, MRN [id number]  PCP:  No PCP Per Patient  Cardiologist:  Thurmon Fair, MD   Chief Complaint  Patient presents with  . New Patient (Initial Visit)    patient complains of some chest discomfort, SOB and heart racing. noticed symptoms in November after being off of his synthroid.    History of Present Illness:  [name] [name] is a 20 y.o. male recently evaluated in the emergency room for rapid palpitations. The palpitations began at rest and he believes that the onset was very abrupt. He denied any dizziness, dyspnea, angina or other untoward effects from the palpitations, but felt very anxious. By the time he arrived to the emergency room his rapid heartbeat had resolved. He is not sure he remembers correctly, but he thinks arrhythmia resolved fairly abruptly.  He does not recall having similar problems in the past. He remembers having a rapid heartbeat once when he felt very faint after a hot shower, but at that time the rapid heartbeat resolved gradually.  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 He noticed some chest discomfort related to moving a heavy table. This is clearly musculoskeletal and can be reproduced by palpation over his left costochondral joints. He has never experienced syncope. She denies exertional dyspnea, except with hard work.  His past medical history significant for Hashimoto's thyroiditis with cheated mild hypothyroidism and some issues with developmental/puberty delay which seem to be behind him now. Here is very slim and is actually frankly underweight. He appears healthy and fit otherwise. He does admit to being rather anxious.    Past Medical History  Diagnosis Date  . Febrile seizure (HCC)   . Physical growth delay   . Gynecomastia, male   .  Puberty delay   . Goiter   . Thyroiditis, autoimmune     Past Surgical History  Procedure Laterality Date  . None      Current Medications: Outpatient Prescriptions Prior to Visit  Medication Sig Dispense Refill  . SYNTHROID 25 MCG tablet TAKE ONE TABLET BY MOUTH IN THE MORNING 30 tablet 0   No facility-administered medications prior to visit.     Allergies:   Review of patient's allergies indicates no known allergies.   Social History   Social History  . Marital Status: Married    Spouse Name: N/A  . Number of Children: N/A  . Years of Education: N/A   Social History Main Topics  . Smoking status: Never Smoker   . Smokeless tobacco: Never Used  . Alcohol Use: No  . Drug Use: No  . Sexual Activity: Not Asked   Other Topics Concern  . None   Social History Narrative     Family History:  The patient's family history includes Diabetes in his paternal grandfather. There is no history of Cancer or Heart disease.   ROS:   Please see the history of present illness.    ROS All other systems reviewed and are negative.   PHYSICAL EXAM:   VS:  BP 114/50 mmHg  Pulse 90  Ht  (1.651 m)  Wt 50.803 kg (112 lb)  BMI 18.64 kg/m2   GEN: Well nourished, well developed, in no acute distress HEENT: normal Neck: no JVD, carotid bruits, or masses  Cardiac: RRR; no murmurs, rubs, or gallops,no edema  Respiratory:  clear to auscultation bilaterally, normal work of breathing GI: soft, nontender, nondistended, + BS MS: no deformity or atrophy Skin: warm and dry, no rash Neuro:  Alert and Oriented x 3, Strength and sensation are intact Psych: euthymic mood, full affect  Wt Readings from Last 3 Encounters:  12/03/15 50.803 kg (112 lb)  11/25/15 49.533 kg (109 lb 3.2 oz)  11/15/15 49.442 kg (109 lb)      Studies/Labs Reviewed:   EKG:  EKG is ordered today.  The ekg ordered today demonstrates Normal sinus rhythm, rightward axis, incomplete right bundle branch block, all  findings that are likely related to his very lean body habitus, likely with a very vertical position of the heart is chest.  Recent Labs: 11/14/2015: BUN 11; Creatinine, Ser 0.88; Hemoglobin 15.5; Platelets 266; Potassium 3.4*; Sodium 140 11/25/2015: TSH 1.21   Lipid Panel No results found for: CHOL, TRIG, HDL, CHOLHDL, VLDL, LDLCALC, LDLDIRECT  Additional studies/ records that were reviewed today include:  March 17 records from the emergency room visit including chest x-ray and labs, office visit with Dr. Fransico MichaelBrennan March 28  ASSESSMENT:    1. Heart palpitations   2. Hypothyroidism, unspecified hypothyroidism type      PLAN:  In order of problems listed above:  1. This young gentleman's palpitations seem to have an abrupt onset and abrupt termination which might mean that he has a true arrhythmia. There is no evidence of preexcitation on his electrocardiogram. The QT interval is completely normal. The RSR prime pattern in lead V1 is almost certainly related to his body habitus rather than a true conduction abnormality. There is no evidence of atrial enlargement. His physical exam is normal and I strongly doubt he has any structural heart disease. It is not unreasonable to consider AV node reentry or even an accessory pathway with concealed conduction. I have recommended that he wear a 30 day event monitor. His episodes of palpitations are infrequent, but if we are like enough to catch one during the 30 days when he wears monitor it would be very helpful. It is also conceivable that he has sinus tachycardia related to anxiety. He seems a rather uneasy and high strung young man. 2. Appropriate thyroid supplementation dose by very recent labs. Unlikely to have any connection with his palpitations.    Medication Adjustments/Labs and Tests Ordered: Current medicines are reviewed at length with the patient today.  Concerns regarding medicines are outlined above.  Medication changes, Labs and Tests  ordered today are listed in the Patient Instructions below. Patient Instructions  Your physician recommends that you continue on your current medications as directed. Please refer to the Current Medication list given to you today.  Your physician has recommended that you wear a 30-day event monitor. Event monitors are medical devices that record the heart's electrical activity. Doctors most often us these monitors to diagnose arrhythmias. Arrhythmias are problems with the speed or rhythm of the heartbeat. The monitor is a small, portable device. You can wear one while you do your normal daily activities. This is usually used to diagnose what is causing palpitations/syncope (passing out).  Dr Royann Shiversroitoru recommends that you follow-up with him as needed.      Joie BimlerSigned, Timiyah Romito, MD  12/03/2015 1:10 PM    [facility] [address], [place], [place]  [address] Phone: [phone]; Fax: [phone]

## 2015-12-03 NOTE — Patient Instructions (Signed)
Your physician recommends that you continue on your current medications as directed. Please refer to the Current Medication list given to you today.  Your physician has recommended that you wear a 30-day event monitor. Event monitors are medical devices that record the heart's electrical activity. Doctors most often us these monitors to diagnose arrhythmias. Arrhythmias are problems with the speed or rhythm of the heartbeat. The monitor is a small, portable device. You can wear one while you do your normal daily activities. This is usually used to diagnose what is causing palpitations/syncope (passing out).  Dr Royann Shiversroitoru recommends that you follow-up with him as needed.

## 2015-12-06 ENCOUNTER — Encounter (HOSPITAL_COMMUNITY): Payer: Self-pay | Admitting: *Deleted

## 2015-12-06 ENCOUNTER — Emergency Department (HOSPITAL_COMMUNITY)
Admission: EM | Admit: 2015-12-06 | Discharge: 2015-12-06 | Disposition: A | Discharge status: Home / Self Care | Payer: Medicaid Other | Attending: Emergency Medicine | Admitting: Emergency Medicine

## 2015-12-06 DIAGNOSIS — Z202 Contact with and (suspected) exposure to infections with a predominantly sexual mode of transmission: Secondary | ICD-10-CM

## 2015-12-06 DIAGNOSIS — Z87438 Personal history of other diseases of male genital organs: Secondary | ICD-10-CM | POA: Diagnosis not present

## 2015-12-06 DIAGNOSIS — R369 Urethral discharge, unspecified: Secondary | ICD-10-CM | POA: Insufficient documentation

## 2015-12-06 DIAGNOSIS — R300 Dysuria: Secondary | ICD-10-CM

## 2015-12-06 DIAGNOSIS — E063 Autoimmune thyroiditis: Secondary | ICD-10-CM | POA: Insufficient documentation

## 2015-12-06 DIAGNOSIS — Z79899 Other long term (current) drug therapy: Secondary | ICD-10-CM | POA: Diagnosis not present

## 2015-12-06 LAB — URINALYSIS, ROUTINE W REFLEX MICROSCOPIC
BILIRUBIN URINE: NEGATIVE
GLUCOSE, UA: NEGATIVE mg/dL
Hgb urine dipstick: NEGATIVE
KETONES UR: NEGATIVE mg/dL
Leukocytes, UA: NEGATIVE
Nitrite: NEGATIVE
PH: 7 (ref 5.0–8.0)
Protein, ur: NEGATIVE mg/dL
SPECIFIC GRAVITY, URINE: 1.02 (ref 1.005–1.030)

## 2015-12-06 MED ORDER — METRONIDAZOLE 500 MG PO TABS
2000.0000 mg | ORAL_TABLET | Freq: Once | ORAL | Status: AC
  Administered 2015-12-06: 2000 mg via ORAL
  Filled 2015-12-06: qty 4

## 2015-12-06 MED ORDER — AZITHROMYCIN 250 MG PO TABS
1000.0000 mg | ORAL_TABLET | Freq: Once | ORAL | Status: AC
  Administered 2015-12-06: 1000 mg via ORAL
  Filled 2015-12-06: qty 4

## 2015-12-06 MED ORDER — LIDOCAINE HCL (PF) 1 % IJ SOLN
2.0000 mL | Freq: Once | INTRAMUSCULAR | Status: AC
  Administered 2015-12-06: 2 mL
  Filled 2015-12-06: qty 5

## 2015-12-06 MED ORDER — CEFTRIAXONE SODIUM 250 MG IJ SOLR
250.0000 mg | Freq: Once | INTRAMUSCULAR | Status: AC
  Administered 2015-12-06: 250 mg via INTRAMUSCULAR
  Filled 2015-12-06: qty 250

## 2015-12-06 NOTE — ED Notes (Signed)
The pt is c/o an itching with burning with urination for 2-3 days

## 2015-12-06 NOTE — ED Provider Notes (Signed)
CSN: [id number]     Arrival date & time 12/06/15  1745 History  By signing my name below, I, Ronney Lion, attest that this documentation has been prepared under the direction and in the presence of Samantha Dowless, PA-C. Electronically Signed: Ronney Lion, ED Scribe. 12/06/2015. 8:18 PM.     Chief Complaint  Patient presents with  . Dysuria   The history is provided by the patient. No language interpreter was used.    HPI Comments: [name] [name] is a 19 y.o. male who presents to the Emergency Department complaining of gradual-onset, constant, moderate redness and itching to the top of his penis that he first noticed today when urinating. He notes a mild burning sensation while urinating. Patient reports he is currently sexually active with one partner. He states he experienced mild penile pain following intercourse yesterday, but that resolved today. No treatments or modifying factors were noted. He denies any penile discharge, abdominal pain, testicular pain, rashes, or genital sores.   Past Medical History  Diagnosis Date  . Febrile seizure (HCC)   . Physical growth delay   . Gynecomastia, male   . Puberty delay   . Goiter   . Thyroiditis, autoimmune    Past Surgical History  Procedure Laterality Date  . None     Family History  Problem Relation Age of Onset  . Diabetes Paternal Grandfather   . Cancer Neg Hx   . Heart disease Neg Hx    Social History  Substance Use Topics  . Smoking status: Never Smoker   . Smokeless tobacco: Never Used  . Alcohol Use: No    Review of Systems  Gastrointestinal: Negative for abdominal pain.  Genitourinary: Positive for dysuria and penile pain (transient, following intercourse yesterday). Negative for discharge, genital sores and testicular pain.       Positive for penile itching and redness.  Skin: Negative for rash.      Allergies  Review of patient's allergies indicates no known allergies.  Home Medications   Prior to Admission  medications   Medication Sig Start Date End Date Taking? Authorizing Provider  SYNTHROID 25 MCG tablet TAKE ONE TABLET BY MOUTH IN THE MORNING 11/03/15   David Stall, MD   BP 120/73 mmHg  Pulse 71  Temp(Src) 97.7 F (36.5 C) (Oral)  Resp 16  Ht  (1.651 m)  Wt 108 lb 3 oz (49.074 kg)  BMI 18.00 kg/m2  SpO2 100% Physical Exam  Constitutional: He is oriented to person, place, and time. He appears well-developed and well-nourished. No distress.  HENT:  Head: Normocephalic and atraumatic.  Eyes: Conjunctivae and EOM are normal.  Neck: Neck supple. No tracheal deviation present.  Cardiovascular: Normal rate.   Pulmonary/Chest: Effort normal. No respiratory distress.  Genitourinary: Right testis shows no tenderness. Left testis shows no tenderness. Penile erythema present. Discharge found.  Glans erythematous, with clear discharge. No external lesions. No testicular tenderness.   Musculoskeletal: Normal range of motion.  Neurological: He is alert and oriented to person, place, and time.  Skin: Skin is warm and dry.  Psychiatric: He has a normal mood and affect. His behavior is normal.  Nursing note and vitals reviewed.   ED Course  Procedures (including critical care time)  DIAGNOSTIC STUDIES: Oxygen Saturation is 100% on RA, normal by my interpretation.    COORDINATION OF CARE: 8:13 PM - Discussed treatment plan with pt at bedside which includes STD testing. Pt verbalized understanding and agreed to plan.   Labs  Review Labs Reviewed  URINALYSIS, ROUTINE W REFLEX MICROSCOPIC (NOT AT [facility])    MDM   Final diagnoses:  Dysuria  Possible exposure to STD   Patient treated in the ED for STI prophylactically with Flagyl, azithromycin and rocephin. UA negative for infection. Pt advised on safe sex practices and understands that they have GC/Chlamydia cultures pending and will result in 2-3 days. HIV and RPR sent. If results are positive, pt is advised to inform and treat  all sexual partners. Pt encouraged to follow up at local health department for future STI checks. No concern for prostatitis or epididymitis. Discussed return precautions. Pt appears safe for discharge.   I personally performed the services described in this documentation, which was scribed in my presence. The recorded information has been reviewed and is accurate.       Lester KinsmanSamantha Tripp LouiseDowless, PA-C 12/08/15 0715  Bethann BerkshireJoseph Zammit, MD 12/09/15 [phone]

## 2015-12-06 NOTE — Discharge Instructions (Signed)
Dysuria Dysuria is pain or discomfort while urinating. The pain or discomfort may be felt in the tube that carries urine out of the bladder (urethra) or in the surrounding tissue of the genitals. The pain may also be felt in the groin area, lower abdomen, and lower back. You may have to urinate frequently or have the sudden feeling that you have to urinate (urgency). Dysuria can affect both men and women, but is more common in women. Dysuria can be caused by many different things, including:  Urinary tract infection in women.  Infection of the kidney or bladder.  Kidney stones or bladder stones.  Certain sexually transmitted infections (STIs), such as chlamydia.  Dehydration.  Inflammation of the vagina.  Use of certain medicines.  Use of certain soaps or scented products that cause irritation. HOME CARE INSTRUCTIONS Watch your dysuria for any changes. The following actions may help to reduce any discomfort you are feeling:  Drink enough fluid to keep your urine clear or pale yellow.  Empty your bladder often. Avoid holding urine for long periods of time.  After a bowel movement or urination, women should cleanse from front to back, using each tissue only once.  Empty your bladder after sexual intercourse.  Take medicines only as directed by your health care provider.  If you were prescribed an antibiotic medicine, finish it all even if you start to feel better.  Avoid caffeine, tea, and alcohol. They can irritate the bladder and make dysuria worse. In men, alcohol may irritate the prostate.  Keep all follow-up visits as directed by your health care provider. This is important.  If you had any tests done to find the cause of dysuria, it is your responsibility to obtain your test results. Ask the lab or department performing the test when and how you will get your results. Talk with your health care provider if you have any questions about your results. SEEK MEDICAL CARE  IF:  You develop pain in your back or sides.  You have a fever.  You have nausea or vomiting.  You have blood in your urine.  You are not urinating as often as you usually do. SEEK IMMEDIATE MEDICAL CARE IF:  You pain is severe and not relieved with medicines.  You are unable to hold down any fluids.  You or someone else notices a change in your mental function.  You have a rapid heartbeat at rest.  You have shaking or chills.  You feel extremely weak.   This information is not intended to replace advice given to you by your health care provider. Make sure you discuss any questions you have with your health care provider.   Document Released: 05/14/2004 Document Revised: 09/06/2014 Document Reviewed: 04/11/2014 Elsevier Interactive Patient Education [employer]2016 Elsevier Inc.  Sexually Transmitted Disease A sexually transmitted disease (STD) is a disease or infection that may be passed (transmitted) from person to person, usually during sexual activity. This may happen by way of saliva, semen, blood, vaginal mucus, or urine. Common STDs include:  Gonorrhea.  Chlamydia.  Syphilis.  HIV and AIDS.  Genital herpes.  Hepatitis B and C.  Trichomonas.  Human papillomavirus (HPV).  Pubic lice.  Scabies.  Mites.  Bacterial vaginosis. WHAT ARE CAUSES OF STDs? An STD may be caused by bacteria, a virus, or parasites. STDs are often transmitted during sexual activity if one person is infected. However, they may also be transmitted through nonsexual means. STDs may be transmitted after:   Sexual intercourse with  an infected person.  Sharing sex toys with an infected person.  Sharing needles with an infected person or using unclean piercing or tattoo needles.  Having intimate contact with the genitals, mouth, or rectal areas of an infected person.  Exposure to infected fluids during birth. WHAT ARE THE SIGNS AND SYMPTOMS OF STDs? Different STDs have different symptoms.  Some people may not have any symptoms. If symptoms are present, they may include:  Painful or bloody urination.  Pain in the pelvis, abdomen, vagina, anus, throat, or eyes.  A skin rash, itching, or irritation.  Growths, ulcerations, blisters, or sores in the genital and anal areas.  Abnormal vaginal discharge with or without bad odor.  Penile discharge in men.  Fever.  Pain or bleeding during sexual intercourse.  Swollen glands in the groin area.  Yellow skin and eyes (jaundice). This is seen with hepatitis.  Swollen testicles.  Infertility.  Sores and blisters in the mouth. HOW ARE STDs DIAGNOSED? To make a diagnosis, your health care provider may:  Take a medical history.  Perform a physical exam.  Take a sample of any discharge to examine.  Swab the throat, cervix, opening to the penis, rectum, or vagina for testing.  Test a sample of your first morning urine.  Perform blood tests.  Perform a Pap test, if this applies.  Perform a colposcopy.  Perform a laparoscopy. HOW ARE STDs TREATED? Treatment depends on the STD. Some STDs may be treated but not cured.  Chlamydia, gonorrhea, trichomonas, and syphilis can be cured with antibiotic medicine.  Genital herpes, hepatitis, and HIV can be treated, but not cured, with prescribed medicines. The medicines lessen symptoms.  Genital warts from HPV can be treated with medicine or by freezing, burning (electrocautery), or surgery. Warts may come back.  HPV cannot be cured with medicine or surgery. However, abnormal areas may be removed from the cervix, vagina, or vulva.  If your diagnosis is confirmed, your recent sexual partners need treatment. This is true even if they are symptom-free or have a negative culture or evaluation. They should not have sex until their health care providers say it is okay.  Your health care provider may test you for infection again 3 months after treatment. HOW CAN I REDUCE MY RISK OF  GETTING AN STD? Take these steps to reduce your risk of getting an STD:  Use latex condoms, dental dams, and water-soluble lubricants during sexual activity. Do not use petroleum jelly or oils.  Avoid having multiple sex partners.  Do not have sex with someone who has other sex partners  Do not have sex with anyone you do not know or who is at high risk for an STD.  Avoid risky sex practices that can break your skin.  Do not have sex if you have open sores on your mouth or skin.  Avoid drinking too much alcohol or taking illegal drugs. Alcohol and drugs can affect your judgment and put you in a vulnerable position.  Avoid engaging in oral and anal sex acts.  Get vaccinated for HPV and hepatitis. If you have not received these vaccines in the past, talk to your health care provider about whether one or both might be right for you.  If you are at risk of being infected with HIV, it is recommended that you take a prescription medicine daily to prevent HIV infection. This is called pre-exposure prophylaxis (PrEP). You are considered at risk if:  You are a man who has sex with  other men (MSM).  You are a heterosexual man or woman and are sexually active with more than one partner.  You take drugs by injection.  You are sexually active with a partner who has HIV.  Talk with your health care provider about whether you are at high risk of being infected with HIV. If you choose to begin PrEP, you should first be tested for HIV. You should then be tested every 3 months for as long as you are taking PrEP. WHAT SHOULD I DO IF I THINK I HAVE AN STD?  See your health care provider.  Tell your sexual partner(s). They should be tested and treated for any STDs.  Do not have sex until your health care provider says it is okay. WHEN SHOULD I GET IMMEDIATE MEDICAL CARE? Contact your health care provider right away if:   You have severe abdominal pain.  You are a man and notice swelling or  pain in your testicles.  You are a woman and notice swelling or pain in your vagina.   This information is not intended to replace advice given to you by your health care provider. Make sure you discuss any questions you have with your health care provider.   Follow-up with [facility] health [facility] if your symptoms do not improve. You have gonorrhea, chlamydia and syphilis cultures pending. Avoid sexual intercourse or use. Contraception method before. Your culture results return. If any of your cultures are positive, he will need to contact all sexual partners to inform them that they may be treated as well. Return to the emergency department if you experience severe abdominal pain, fever, testicular pain or swelling.

## 2015-12-07 LAB — SYPHILIS: RPR W/REFLEX TO RPR TITER AND TREPONEMAL ANTIBODIES, TRADITIONAL SCREENING AND DIAGNOSIS ALGORITHM: RPR Ser Ql: NONREACTIVE

## 2015-12-07 LAB — HIV ANTIBODY (ROUTINE TESTING W REFLEX): HIV Screen 4th Generation wRfx: NONREACTIVE

## 2015-12-08 LAB — URINE CULTURE: Culture: NO GROWTH

## 2015-12-08 LAB — GC/CHLAMYDIA PROBE AMP (~~LOC~~) NOT AT ARMC
Chlamydia: NEGATIVE
Neisseria Gonorrhea: NEGATIVE

## 2015-12-09 ENCOUNTER — Telehealth: Payer: Self-pay | Admitting: Cardiovascular Disease

## 2015-12-09 NOTE — Telephone Encounter (Signed)
Noted. Monitor placed in Dr. Renaye Rakers's mail basket

## 2015-12-09 NOTE — Telephone Encounter (Signed)
Received "serious" asymptomatic report from [facility] on a patient of Dr. C - HR 160s-170s.   Called patient. He has no complaints. He was working during the time frame of the monitor report ~ 9am He had no complaints.   Will defer to Dr. Rennis GoldenHilty

## 2015-12-09 NOTE — Telephone Encounter (Signed)
No treatment at this time - continue to monitor.  Dr. HRexene Edison

## 2015-12-23 IMAGING — CR DG CHEST 2V
2 series · 2 of 2 positions shown · non-contrast
Comparison: None.

CLINICAL DATA: Chest pain

EXAM:
CHEST - 2 VIEW

[PA]
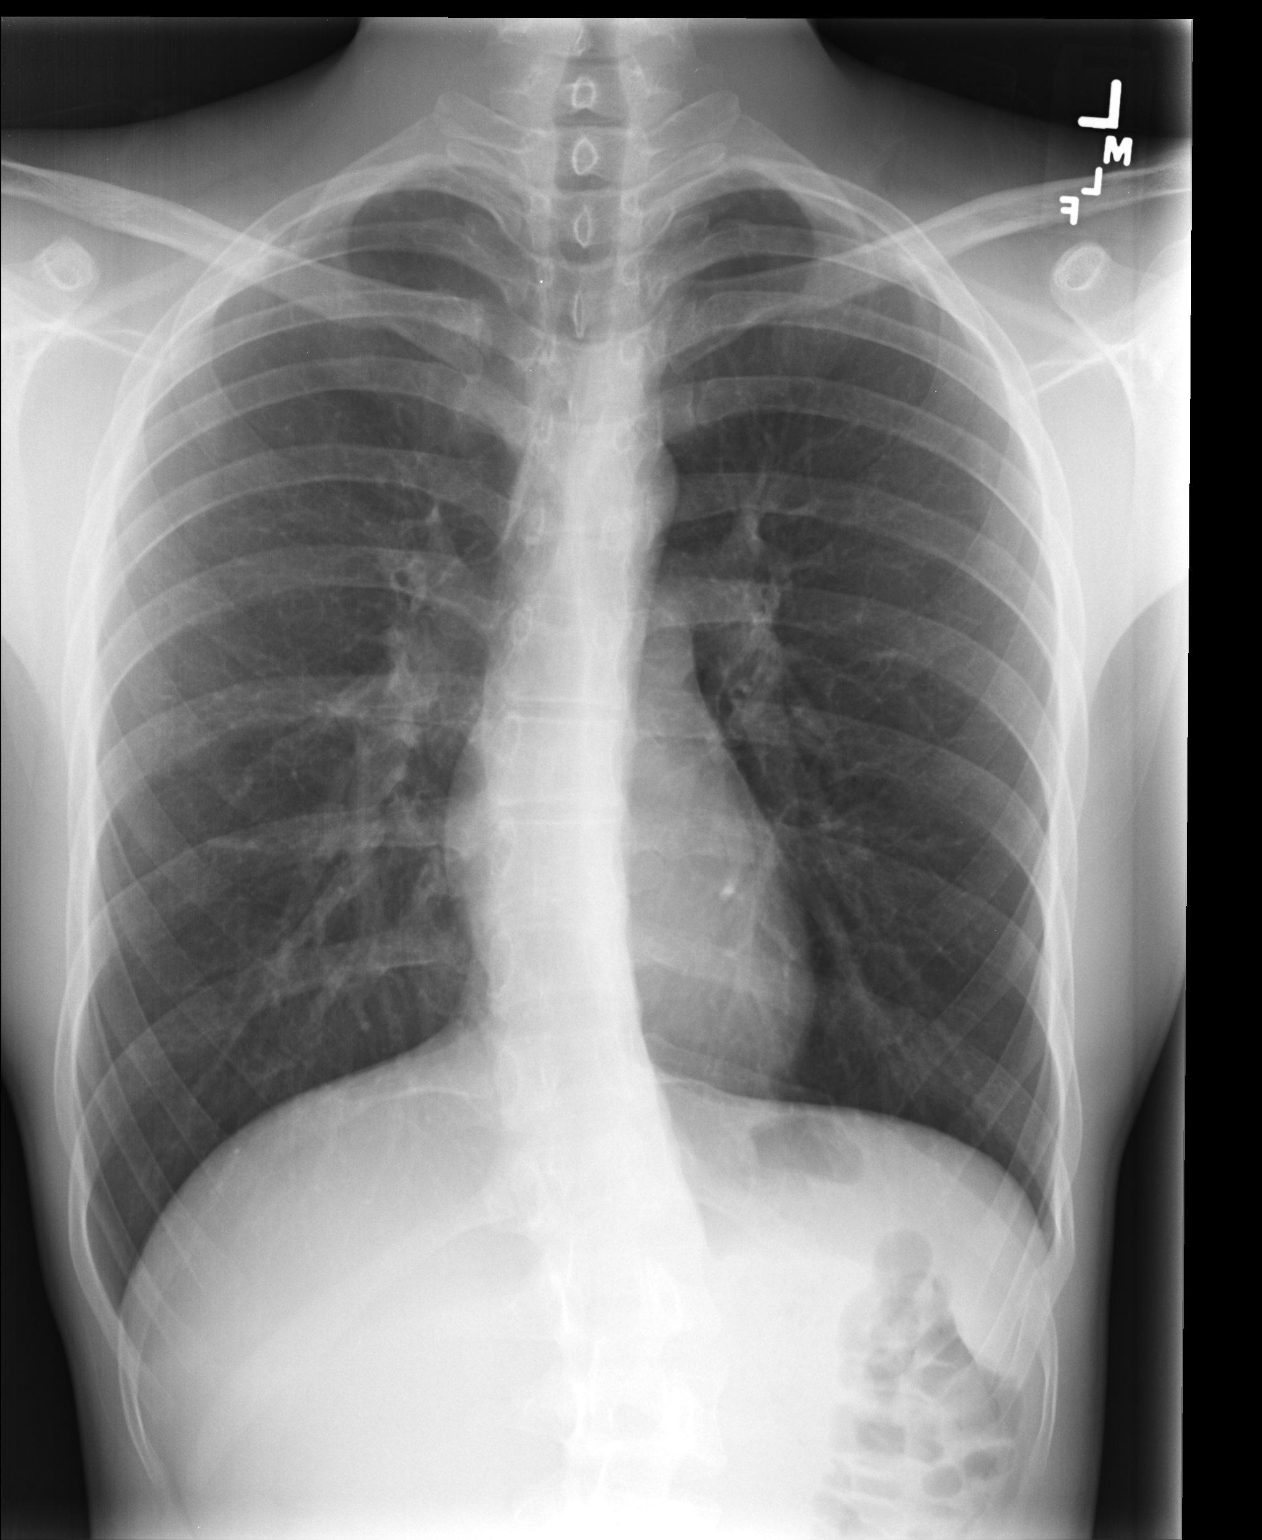

[lateral]
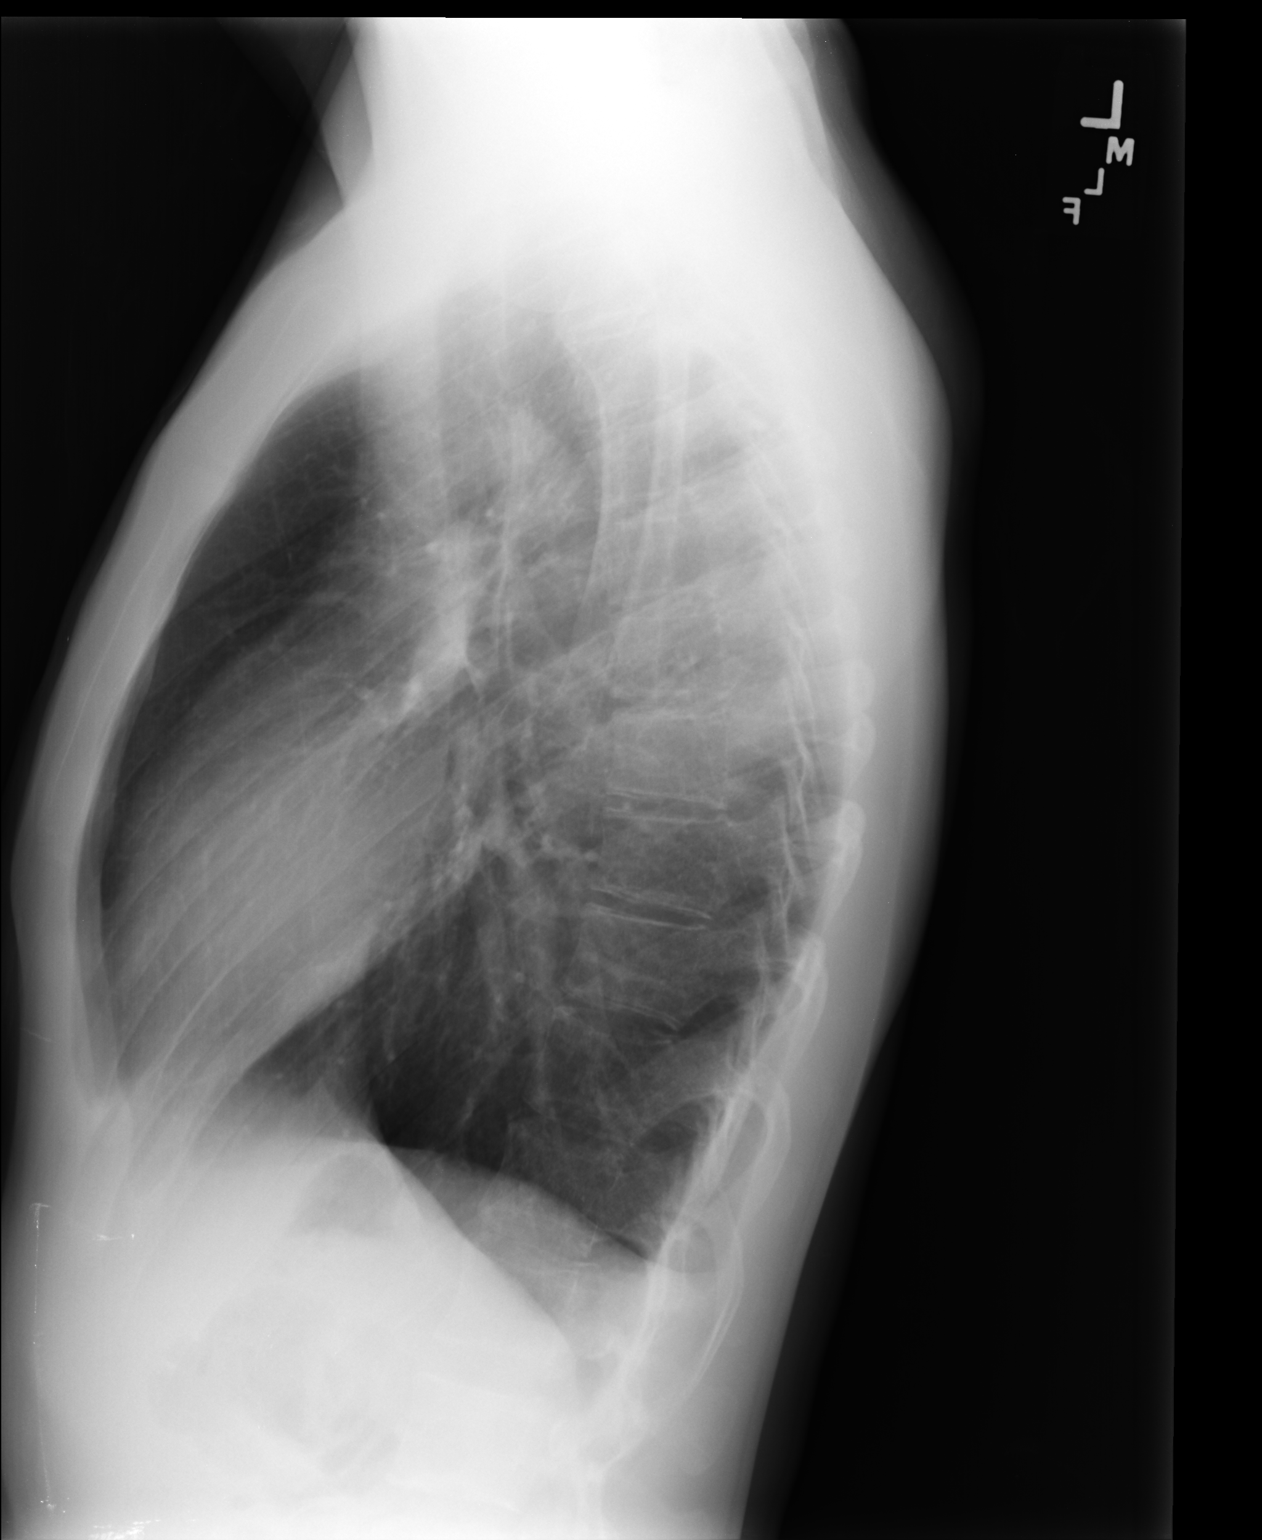

[2 of 2 positions shown; findings below may reference images not displayed]

FINDINGS: Cardiac shadow is within normal limits. The lungs are well aerated
bilaterally. No focal infiltrate, effusion or pneumothorax is noted.
Mild scoliosis is seen in the thoracolumbar spine.
IMPRESSION: No active disease.

## 2015-12-26 ENCOUNTER — Emergency Department (HOSPITAL_COMMUNITY): Payer: Medicaid Other

## 2015-12-26 ENCOUNTER — Emergency Department (HOSPITAL_COMMUNITY)
Admission: EM | Admit: 2015-12-26 | Discharge: 2015-12-26 | Disposition: A | Discharge status: Home / Self Care | Payer: Medicaid Other | Attending: Emergency Medicine | Admitting: Emergency Medicine

## 2015-12-26 ENCOUNTER — Encounter (HOSPITAL_COMMUNITY): Payer: Self-pay | Admitting: Emergency Medicine

## 2015-12-26 DIAGNOSIS — R0789 Other chest pain: Secondary | ICD-10-CM | POA: Diagnosis not present

## 2015-12-26 DIAGNOSIS — E876 Hypokalemia: Secondary | ICD-10-CM | POA: Diagnosis not present

## 2015-12-26 DIAGNOSIS — Z79899 Other long term (current) drug therapy: Secondary | ICD-10-CM | POA: Diagnosis not present

## 2015-12-26 DIAGNOSIS — J029 Acute pharyngitis, unspecified: Secondary | ICD-10-CM | POA: Diagnosis present

## 2015-12-26 DIAGNOSIS — E063 Autoimmune thyroiditis: Secondary | ICD-10-CM | POA: Diagnosis not present

## 2015-12-26 DIAGNOSIS — Z87438 Personal history of other diseases of male genital organs: Secondary | ICD-10-CM | POA: Insufficient documentation

## 2015-12-26 LAB — I-STAT CHEM 8, ED
BUN: 18 mg/dL (ref 6–20)
CALCIUM ION: 1.18 mmol/L (ref 1.12–1.23)
Chloride: 102 mmol/L (ref 101–111)
Creatinine, Ser: 0.7 mg/dL (ref 0.61–1.24)
GLUCOSE: 94 mg/dL (ref 65–99)
HEMATOCRIT: 50 % (ref 39.0–52.0)
HEMOGLOBIN: 17 g/dL (ref 13.0–17.0)
Potassium: 3.2 mmol/L — ABNORMAL LOW (ref 3.5–5.1)
Sodium: 143 mmol/L (ref 135–145)
TCO2: 27 mmol/L (ref 0–100)

## 2015-12-26 LAB — I-STAT TROPONIN, ED: Troponin i, poc: 0 ng/mL (ref 0.00–0.08)

## 2015-12-26 LAB — RAPID STREP SCREEN (MED CTR MEBANE ONLY): Streptococcus, Group A Screen (Direct): NEGATIVE

## 2015-12-26 MED ORDER — PHENOL 1.4 % MT LIQD
1.0000 | OROMUCOSAL | Status: DC | PRN
  Administered 2015-12-26: 1 via OROMUCOSAL
  Filled 2015-12-26: qty 177

## 2015-12-26 MED ORDER — POTASSIUM CHLORIDE CRYS ER 20 MEQ PO TBCR
40.0000 meq | EXTENDED_RELEASE_TABLET | Freq: Once | ORAL | Status: AC
  Administered 2015-12-26: 40 meq via ORAL
  Filled 2015-12-26: qty 2

## 2015-12-26 NOTE — Discharge Instructions (Signed)
Continue to stay well-hydrated. Gargle warm salt water and spit it out, and use chloraseptic spray as needed for sore throat. Continue to alternate between Tylenol and Ibuprofen for pain or fever. May consider using heat or ice to the areas of soreness in your chest as needed for pain. Your labs showed a slightly low potassium, but you were given potassium supplementation here so you do not need to do anything further for this at this time, look at the list of foods below to see if there are any potassium-rich foods you could add into your diet to help supplement in the future. Followup with your primary care doctor and your cardiologist in 5-7 days for recheck of ongoing symptoms. Return to emergency department for emergent changing or worsening of symptoms.   Sore Throat A sore throat is pain, burning, irritation, or scratchiness of the throat. There is often pain or tenderness when swallowing or talking. A sore throat may be accompanied by other symptoms, such as coughing, sneezing, fever, and swollen neck glands. A sore throat is often the first sign of another sickness, such as a cold, flu, strep throat, or mononucleosis (commonly known as mono). Most sore throats go away without medical treatment. CAUSES  The most common causes of a sore throat include:  A viral infection, such as a cold, flu, or mono.  A bacterial infection, such as strep throat, tonsillitis, or whooping cough.  Seasonal allergies.  Dryness in the air.  Irritants, such as smoke or pollution.  Gastroesophageal reflux disease (GERD). HOME CARE INSTRUCTIONS   Only take over-the-counter medicines as directed by your caregiver.  Drink enough fluids to keep your urine clear or pale yellow.  Rest as needed.  Try using throat sprays, lozenges, or sucking on hard candy to ease any pain (if older than 4 years or as directed).  Sip warm liquids, such as broth, herbal tea, or warm water with honey to relieve pain temporarily.  You may also eat or drink cold or frozen liquids such as frozen ice pops.  Gargle with salt water (mix 1 tsp salt with 8 oz of water).  Do not smoke and avoid secondhand smoke.  Put a cool-mist humidifier in your bedroom at night to moisten the air. You can also turn on a hot shower and sit in the bathroom with the door closed for 5-10 minutes. SEEK IMMEDIATE MEDICAL CARE IF:  You have difficulty breathing.  You are unable to swallow fluids, soft foods, or your saliva.  You have increased swelling in the throat.  Your sore throat does not get better in 7 days.  You have nausea and vomiting.  You have a fever or persistent symptoms for more than 2-3 days.  You have a fever and your symptoms suddenly get worse. MAKE SURE YOU:   Understand these instructions.  Will watch your condition.  Will get help right away if you are not doing well or get worse.   This information is not intended to replace advice given to you by your health care provider. Make sure you discuss any questions you have with your health care provider.   Document Released: 09/23/2004 Document Revised: 09/06/2014 Document Reviewed: 04/23/2012 Elsevier Interactive Patient Education 2016 Elsevier Inc.  Chest Wall Pain Chest wall pain is pain in or around the bones and muscles of your chest. Sometimes, an injury causes this pain. Sometimes, the cause may not be known. This pain may take several weeks or longer to get better. HOME CARE Pay  attention to any changes in your symptoms. Take these actions to help with your pain:  Rest as told by your doctor.  Avoid activities that cause pain. Try not to use your chest, belly (abdominal), or side muscles to lift heavy things.  If directed, apply ice to the painful area:  Put ice in a plastic bag.  Place a towel between your skin and the bag.  Leave the ice on for 20 minutes, 2-3 times per day.  Take over-the-counter and prescription medicines only as told by  your doctor.  Do not use tobacco products, including cigarettes, chewing tobacco, and e-cigarettes. If you need help quitting, ask your doctor.  Keep all follow-up visits as told by your doctor. This is important. GET HELP IF:  You have a fever.  Your chest pain gets worse.  You have new symptoms. GET HELP RIGHT AWAY IF:  You feel sick to your stomach (nauseous) or you throw up (vomit).  You feel sweaty or light-headed.  You have a cough with phlegm (sputum) or you cough up blood.  You are short of breath.   This information is not intended to replace advice given to you by your health care provider. Make sure you discuss any questions you have with your health care provider.   Document Released: 02/02/2008 Document Revised: 05/07/2015 Document Reviewed: 11/11/2014 Elsevier Interactive Patient Education 2016 [employer]   Hypokalemia Hypokalemia means that the amount of potassium in the blood is lower than normal.Potassium is a chemical, called an electrolyte, that helps regulate the amount of fluid in the body. It also stimulates muscle contraction and helps nerves function properly.Most of the body's potassium is inside of cells, and only a very small amount is in the blood. Because the amount in the blood is so small, minor changes can be life-threatening. CAUSES  Antibiotics.  Diarrhea or vomiting.  Using laxatives too much, which can cause diarrhea.  Chronic kidney disease.  Water pills (diuretics).  Eating disorders (bulimia).  Low magnesium level.  Sweating a lot. SIGNS AND SYMPTOMS  Weakness.  Constipation.  Fatigue.  Muscle cramps.  Mental confusion.  Skipped heartbeats or irregular heartbeat (palpitations).  Tingling or numbness. DIAGNOSIS  Your health care provider can diagnose hypokalemia with blood tests. In addition to checking your potassium level, your health care provider may also check other lab tests. TREATMENT Hypokalemia can  be treated with potassium supplements taken by mouth or adjustments in your current medicines. If your potassium level is very low, you may need to get potassium through a vein (IV) and be monitored in the hospital. A diet high in potassium is also helpful. Foods high in potassium are:  Nuts, such as peanuts and pistachios.  Seeds, such as sunflower seeds and pumpkin seeds.  Peas, lentils, and lima beans.  Whole grain and bran cereals and breads.  Fresh fruit and vegetables, such as apricots, avocado, bananas, cantaloupe, kiwi, oranges, tomatoes, asparagus, and potatoes.  Orange and tomato juices.  Red meats.  Fruit yogurt. HOME CARE INSTRUCTIONS  Take all medicines as prescribed by your health care provider.  Maintain a healthy diet by including nutritious food, such as fruits, vegetables, nuts, whole grains, and lean meats.  If you are taking a laxative, be sure to follow the directions on the label. SEEK MEDICAL CARE IF:  Your weakness gets worse.  You feel your heart pounding or racing.  You are vomiting or having diarrhea.  You are diabetic and having trouble keeping your blood glucose  in the normal range. SEEK IMMEDIATE MEDICAL CARE IF:  You have chest pain, shortness of breath, or dizziness.  You are vomiting or having diarrhea for more than 2 days.  You faint. MAKE SURE YOU:   Understand these instructions.  Will watch your condition.  Will get help right away if you are not doing well or get worse.   This information is not intended to replace advice given to you by your health care provider. Make sure you discuss any questions you have with your health care provider.   Document Released: 08/16/2005 Document Revised: 09/06/2014 Document Reviewed: 02/16/2013 Elsevier Interactive Patient Education 2016 Elsevier Inc.  Potassium Content of Foods Potassium is a mineral found in many foods and drinks. It helps keep fluids and minerals balanced in your body  and affects how steadily your heart beats. Potassium also helps control your blood pressure and keep your muscles and nervous system healthy. Certain health conditions and medicines may change the balance of potassium in your body. When this happens, you can help balance your level of potassium through the foods that you do or do not eat. Your health care provider or dietitian may recommend an amount of potassium that you should have each day. The following lists of foods provide the amount of potassium (in parentheses) per serving in each item. HIGH IN POTASSIUM  The following foods and beverages have 200 mg or more of potassium per serving:  Apricots, 2 raw or 5 dry (200 mg).  Artichoke, 1 medium (345 mg).  Avocado, raw,  each (245 mg).  Banana, 1 medium (425 mg).  Beans, lima, or baked beans, canned,  cup (280 mg).  Beans, white, canned,  cup (595 mg).  Beef roast, 3 oz (320 mg).  Beef, ground, 3 oz (270 mg).  Beets, raw or cooked,  cup (260 mg).  Bran muffin, 2 oz (300 mg).  Broccoli,  cup (230 mg).  Brussels sprouts,  cup (250 mg).  Cantaloupe,  cup (215 mg).  Cereal, 100% bran,  cup (200-400 mg).  Cheeseburger, single, fast food, 1 each (225-400 mg).  Chicken, 3 oz (220 mg).  Clams, canned, 3 oz (535 mg).  Crab, 3 oz (225 mg).  Dates, 5 each (270 mg).  Dried beans and peas,  cup (300-475 mg).  Figs, dried, 2 each (260 mg).  Fish: halibut, tuna, cod, snapper, 3 oz (480 mg).  Fish: salmon, haddock, swordfish, perch, 3 oz (300 mg).  Fish, tuna, canned 3 oz (200 mg).  [place] fries, fast food, 3 oz (470 mg).  Granola with fruit and nuts,  cup (200 mg).  Grapefruit juice,  cup (200 mg).  Greens, beet,  cup (655 mg).  Honeydew melon,  cup (200 mg).  Kale, raw, 1 cup (300 mg).  Kiwi, 1 medium (240 mg).  Kohlrabi, rutabaga, parsnips,  cup (280 mg).  Lentils,  cup (365 mg).  Mango, 1 each (325 mg).  Milk, chocolate, 1 cup (420  mg).  Milk: nonfat, low-fat, whole, buttermilk, 1 cup (350-380 mg).  Molasses, 1 Tbsp (295 mg).  Mushrooms,  cup (280) mg.  Nectarine, 1 each (275 mg).  Nuts: almonds, peanuts, hazelnuts, [place], cashew, mixed, 1 oz (200 mg).  Nuts, pistachios, 1 oz (295 mg).  Orange, 1 each (240 mg).  Orange juice,  cup (235 mg).  Papaya, medium,  fruit (390 mg).  Peanut butter, chunky, 2 Tbsp (240 mg).  Peanut butter, smooth, 2 Tbsp (210 mg).  Pear, 1 medium (200 mg).  Pomegranate, 1 whole (400 mg).  Pomegranate juice,  cup (215 mg).  Pork, 3 oz (350 mg).  Potato chips, salted, 1 oz (465 mg).  Potato, baked with skin, 1 medium (925 mg).  Potatoes, boiled,  cup (255 mg).  Potatoes, mashed,  cup (330 mg).  Prune juice,  cup (370 mg).  Prunes, 5 each (305 mg).  Pudding, chocolate,  cup (230 mg).  Pumpkin, canned,  cup (250 mg).  Raisins, seedless,  cup (270 mg).  Seeds, sunflower or pumpkin, 1 oz (240 mg).  Soy milk, 1 cup (300 mg).  Spinach,  cup (420 mg).  Spinach, canned,  cup (370 mg).  Sweet potato, baked with skin, 1 medium (450 mg).  Swiss chard,  cup (480 mg).  Tomato or vegetable juice,  cup (275 mg).  Tomato sauce or puree,  cup (400-550 mg).  Tomato, raw, 1 medium (290 mg).  Tomatoes, canned,  cup (200-300 mg).  [place], 3 oz (250 mg).  Wheat germ, 1 oz (250 mg).  Winter squash,  cup (250 mg).  Yogurt, plain or fruited, 6 oz (260-435 mg).  Zucchini,  cup (220 mg). MODERATE IN POTASSIUM The following foods and beverages have 50-200 mg of potassium per serving:  Apple, 1 each (150 mg).  Apple juice,  cup (150 mg).  Applesauce,  cup (90 mg).  Apricot nectar,  cup (140 mg).  Asparagus, small spears,  cup or 6 spears (155 mg).  Bagel, cinnamon raisin, 1 each (130 mg).  Bagel, egg or plain, 4 in., 1 each (70 mg).  Beans, green,  cup (90 mg).  Beans, yellow,  cup (190 mg).  Beer, regular, 12 oz (100  mg).  Beets, canned,  cup (125 mg).  Blackberries,  cup (115 mg).  Blueberries,  cup (60 mg).  Bread, whole wheat, 1 slice (70 mg).  Broccoli, raw,  cup (145 mg).  Cabbage,  cup (150 mg).  Carrots, cooked or raw,  cup (180 mg).  Cauliflower, raw,  cup (150 mg).  Celery, raw,  cup (155 mg).  Cereal, bran flakes, cup (120-150 mg).  Cheese, cottage,  cup (110 mg).  Cherries, 10 each (150 mg).  Chocolate, 1 oz bar (165 mg).  Coffee, brewed 6 oz (90 mg).  Corn,  cup or 1 ear (195 mg).  Cucumbers,  cup (80 mg).  Egg, large, 1 each (60 mg).  Eggplant,  cup (60 mg).  Endive, raw, cup (80 mg).  English muffin, 1 each (65 mg).  Fish, orange roughy, 3 oz (150 mg).  Frankfurter, beef or pork, 1 each (75 mg).  Fruit cocktail,  cup (115 mg).  Grape juice,  cup (170 mg).  Grapefruit,  fruit (175 mg).  Grapes,  cup (155 mg).  Greens: kale, turnip, collard,  cup (110-150 mg).  Ice cream or frozen yogurt, chocolate,  cup (175 mg).  Ice cream or frozen yogurt, vanilla,  cup (120-150 mg).  Lemons, limes, 1 each (80 mg).  Lettuce, all types, 1 cup (100 mg).  Mixed vegetables,  cup (150 mg).  Mushrooms, raw,  cup (110 mg).  Nuts: walnuts, pecans, or macadamia, 1 oz (125 mg).  Oatmeal,  cup (80 mg).  Okra,  cup (110 mg).  Onions, raw,  cup (120 mg).  Peach, 1 each (185 mg).  Peaches, canned,  cup (120 mg).  Pears, canned,  cup (120 mg).  Peas, green, frozen,  cup (90 mg).  Peppers, green,  cup (130 mg).  Peppers, red,  cup (160  mg).  Pineapple juice,  cup (165 mg).  Pineapple, fresh or canned,  cup (100 mg).  Plums, 1 each (105 mg).  Pudding, vanilla,  cup (150 mg).  Raspberries,  cup (90 mg).  Rhubarb,  cup (115 mg).  Rice, wild,  cup (80 mg).  Shrimp, 3 oz (155 mg).  Spinach, raw, 1 cup (170 mg).  Strawberries,  cup (125 mg).  Summer squash  cup (175-200 mg).  Swiss chard, raw, 1 cup (135  mg).  Tangerines, 1 each (140 mg).  Tea, brewed, 6 oz (65 mg).  Turnips,  cup (140 mg).  Watermelon,  cup (85 mg).  Wine, red, table, 5 oz (180 mg).  Wine, white, table, 5 oz (100 mg). LOW IN POTASSIUM The following foods and beverages have less than 50 mg of potassium per serving.  Bread, white, 1 slice (30 mg).  Carbonated beverages, 12 oz (less than 5 mg).  Cheese, 1 oz (20-30 mg).  Cranberries,  cup (45 mg).  Cranberry juice cocktail,  cup (20 mg).  Fats and oils, 1 Tbsp (less than 5 mg).  Hummus, 1 Tbsp (32 mg).  Nectar: papaya, mango, or pear,  cup (35 mg).  Rice, white or brown,  cup (50 mg).  Spaghetti or macaroni,  cup cooked (30 mg).  Tortilla, flour or corn, 1 each (50 mg).  Waffle, 4 in., 1 each (50 mg).  Water chestnuts,  cup (40 mg).   This information is not intended to replace advice given to you by your health care provider. Make sure you discuss any questions you have with your health care provider.   Document Released: 03/30/2005 Document Revised: 08/21/2013 Document Reviewed: 07/13/2013 Elsevier Interactive Patient Education [employer]

## 2015-12-26 NOTE — ED Notes (Signed)
Patient reports having sore throat since Sunday. Reports "feels tight" when he swallows. Patient able to speak easily in full sentences.

## 2015-12-26 NOTE — ED Provider Notes (Signed)
CSN: [id number]     Arrival date & time 12/26/15  2144 History  By signing my name below, I, [name] [name], attest that this documentation has been prepared under the direction and in the presence of [address] [employer]. Electronically Signed: Tanda [name], ED Scribe. 12/26/2015. 10:20 PM.   Chief Complaint  Patient presents with  . Sore Throat   Patient is a 20 y.o. male presenting with pharyngitis. The history is provided by the patient and medical records. No language interpreter was used.  Sore Throat This is a new problem. The current episode started more than 2 days ago. The problem occurs constantly. The problem has not changed since onset.Associated symptoms include chest pain (L chest wall). Pertinent negatives include no abdominal pain, no headaches and no shortness of breath. The symptoms are aggravated by swallowing and eating. Nothing relieves the symptoms. He has tried nothing for the symptoms. The treatment provided no relief.     HPI Comments: [name] [name] is a 20 y.o. male with PMHx thyroiditis, goiter, hypothyroidism currently on Synthroid, and intermittent palpitations under the care of cardiology (last visit 12/03/15, plan was to do 30 day holter monitoring), who presents to the Emergency Department complaining of gradual onset, constant, 1/10, squeezing/soreness, non-radiating throat pain x 5 days. The pain is exacerbated with swallowing and eating. He has not taken anything for the pain. No recent sick contact with similar symptoms. Pt is able to swallow, open and close his mouth, and tolerate his own secretions. He also complains of left lateral chest wall pain that began earlier today while he was picking up a heavy ladder at work today and believes he may have strained his ribs at work. Denies fever, chills, ear pain, ear drainage, rhinorrhea, cough, drooling, trismus, shortness of breath, abdominal pain, nausea, vomiting, diarrhea, constipation, dysuria, hematuria,  weakness, numbness, tingling, or any other associated symptoms. Pt is non smoker. +FHx of unknown cardiac issues in his grandmother but doesn't know if anyone has had a heart attack, unable to specify what type of cardiac illness his grandmother has.   Per chart review:  Pt's TFTs on 11/25/15 were WNL. Was seen at cardiologist's office on 12/03/15 for palpitations, is supposed to be on 30 day holter monitoring.  Past Medical History  Diagnosis Date  . Febrile seizure (HCC)   . Physical growth delay   . Gynecomastia, male   . Puberty delay   . Goiter   . Thyroiditis, autoimmune    Past Surgical History  Procedure Laterality Date  . None     Family History  Problem Relation Age of Onset  . Diabetes Paternal Grandfather   . Cancer Neg Hx   . Heart disease Neg Hx    Social History  Substance Use Topics  . Smoking status: Never Smoker   . Smokeless tobacco: Never Used  . Alcohol Use: No    Review of Systems  Constitutional: Negative for fever and chills.  HENT: Positive for sore throat. Negative for drooling, ear discharge, ear pain, rhinorrhea and trouble swallowing.   Respiratory: Negative for cough and shortness of breath.   Cardiovascular: Positive for chest pain (L chest wall).  Gastrointestinal: Negative for nausea, vomiting, abdominal pain, diarrhea and constipation.  Genitourinary: Negative for dysuria and hematuria.  Musculoskeletal: Negative for myalgias and arthralgias.  Skin: Negative for rash.  Allergic/Immunologic: Negative for immunocompromised state.  Neurological: Negative for weakness, numbness and headaches.  Psychiatric/Behavioral: Negative for confusion.   A complete 10 system review of systems  was obtained and all systems are negative except as noted in the HPI and PMH.    Allergies  Review of patient's allergies indicates no known allergies.  Home Medications   Prior to Admission medications   Medication Sig Start Date End Date Taking? Authorizing  Provider  SYNTHROID 25 MCG tablet TAKE ONE TABLET BY MOUTH IN THE MORNING 11/03/15   David StallMichael J Brennan, MD   BP 137/78 mmHg  Pulse 79  Temp(Src) 98.2 F (36.8 C) (Oral)  Resp 18  Ht 5\' 6"  (1.676 m)  Wt 100 lb (45.36 kg)  BMI 16.15 kg/m2  SpO2 100%   Physical Exam  Constitutional: He is oriented to person, place, and time. Vital signs are normal. He appears well-developed and well-nourished.  Non-toxic appearance. No distress.  Afebrile, nontoxic, NAD  HENT:  Head: Normocephalic and atraumatic.  Nose: Nose normal.  Mouth/Throat: Uvula is midline and mucous membranes are normal. No trismus in the jaw. No uvula swelling. Posterior oropharyngeal erythema present. No oropharyngeal exudate, posterior oropharyngeal edema or tonsillar abscesses.  Nose clear. Oropharynx with mild injection, without uvular swelling or deviation, no trismus or drooling, no tonsillar swelling, no exudates. No PTA.   Eyes: Conjunctivae and EOM are normal. Right eye exhibits no discharge. Left eye exhibits no discharge.  Neck: Normal range of motion. Neck supple. No thyromegaly present.  Cardiovascular: Normal rate, regular rhythm, normal heart sounds and intact distal pulses.  Exam reveals no gallop and no friction rub.   No murmur heard. RRR, nl s1/s2, no m/r/g, distal pulses intact, no pedal edema  Pulmonary/Chest: Effort normal and breath sounds normal. No respiratory distress. He has no decreased breath sounds. He has no wheezes. He has no rhonchi. He has no rales. He exhibits tenderness. He exhibits no crepitus, no deformity and no retraction.    Left lateral chest wall with mild TTP without crepitus, deformities, or retractions  Abdominal: Soft. Normal appearance and bowel sounds are normal. He exhibits no distension. There is no tenderness. There is no rigidity, no rebound, no guarding, no CVA tenderness, no tenderness at McBurney's point and negative Murphy's sign.  Musculoskeletal: Normal range of motion.   MAE x4 Strength and sensation grossly intact Distal pulses intact Gait steady No pedal edema, neg homan's bilaterally  Lymphadenopathy:       Head (right side): No submandibular and no tonsillar adenopathy present.       Head (left side): No submandibular and no tonsillar adenopathy present.    He has no cervical adenopathy.  No head or neck LAD  Neurological: He is alert and oriented to person, place, and time. He has normal strength. No sensory deficit.  Skin: Skin is warm, dry and intact. No rash noted.  Psychiatric: He has a normal mood and affect.  Nursing note and vitals reviewed.   ED Course  Procedures (including critical care time)  DIAGNOSTIC STUDIES: Oxygen Saturation is 100% on RA, normal by my interpretation.    COORDINATION OF CARE: 10:19 PM-Discussed treatment plan which includes rapid strep test and EKG with pt at bedside and pt agreed to plan.   Labs Review Labs Reviewed  I-STAT CHEM 8, ED - Abnormal; Notable for the following:    Potassium 3.2 (*)    All other components within normal limits  RAPID STREP SCREEN (NOT AT [facility])  CULTURE, GROUP A STREP [facility])  Rosezena SensorI-STAT TROPOININ, ED  Results for Herb GraysCISO, Capers (MRN [id number]) as of 12/26/2015 23:01  Ref. Range 11/25/2015 15:27  TSH Latest  Ref Range: 0.40-4.50 mIU/L 1.21  T4,Free(Direct) Latest Ref Range: 0.8-1.4 ng/dL 1.2  Triiodothyronine,Free,Serum Latest Ref Range: 3.0-4.7 pg/mL 3.4    Imaging Review Dg Chest 2 View  12/26/2015  CLINICAL DATA:  Chest pain after heavy lifting.  Initial encounter. EXAM: CHEST  2 VIEW COMPARISON:  11/14/2015 FINDINGS: Normal heart size and mediastinal contours. No acute infiltrate or edema. No effusion or pneumothorax. Thoracic dextroscoliosis. No acute osseous findings. IMPRESSION: 1. No acute finding. 2. Scoliosis. Electronically Signed   By: Marnee Spring M.D.   On: 12/26/2015 22:51   I have personally reviewed and evaluated these lab results as part of my medical  decision-making.   EKG Interpretation   Date/Time:  Friday December 26 2015 22:30:37 EDT Ventricular Rate:  72 PR Interval:  166 QRS Duration: 90 QT Interval:  360 QTC Calculation: 394 R Axis:   97 Text Interpretation:  Sinus rhythm with marked sinus arrhythmia Rightward  axis Borderline ECG Confirmed by HAVILAND MD, JULIE ([id number]) on 12/26/2015  10:37:48 PM      MDM   Final diagnoses:  Sore throat  Chest wall pain  Hypokalemia    20 y.o. male here with sore throat x5 days, feels tightness when he swallows. Throat mildly erythematous, no LAD, no fevers, no exudates, no other URI symptoms. No trismus or drooling, no evidence of PTA. RST was sent in triage, will await these results but doubt strep as an etiology. He has hx of thyroiditis, but recent TFTs were WNL on 11/25/15 and no thyromegaly or concerning findings for thyroid etiology. He also reports L chest wall pain that started after he lifted a ladder at work, feels like he pulled something in his ribcage. Tenderness to lateral chest wall, no crepitus.  No SOB, no tachycardia or hypoxia, doubt PE. Low-risk HEART score, and interestingly he has seen cardiologist on 12/03/15 for intermittent palpitations and he was supposed to be on a holter monitor x30 days (not currently wearing this). Will get basic labs, EKG, and CXR to eval for any other etiology of his chest wall pain, but likely just musculoskeletal chest wall pain. Will give chloraseptic spray for throat and reassess shortly. Doubt need for morphine/NTG/ASA.  11:00 PM RST neg, doubt strep or bacterial infection, likely viral, will treat symptomatically. CXR neg for acute findings. EKG with rightward axis deviation with some sinus arrhythmia which is odd and is not seen on his EKG from 11/14/15 or 12/03/15; no acute ischemic findings, doubt need for urgent admission/further eval today but will have him f/up with cardiologist. Trop neg. Labs reveal slightly low K 3.2 which has been seen  previously, will supplement today and give a list of potassium rich foods. Chest wall pain likely just from pulled rib muscle, discussed ice/heat and tylenol/motrin for pain. F/up with cardiologist in 5-7 days and with his PCP in 5-7 days. I explained the diagnosis and have given explicit precautions to return to the ER including for any other new or worsening symptoms. The patient understands and accepts the medical plan as it's been dictated and I have answered their questions. Discharge instructions concerning home care and prescriptions have been given. The patient is STABLE and is discharged to home in good condition.   I personally performed the services described in this documentation, which was scribed in my presence. The recorded information has been reviewed and is accurate.   BP 137/78 mmHg  Pulse 79  Temp(Src) 98.2 F (36.8 C) (Oral)  Resp 18  Ht 5\' 6"  (  1.676 m)  Wt 45.36 kg  BMI 16.15 kg/m2  SpO2 100%  Meds ordered this encounter  Medications  . phenol (CHLORASEPTIC) mouth spray 1 spray    Sig:   . potassium chloride SA (K-DUR,KLOR-CON) CR tablet 40 mEq    Sig:        Jeno Calleros Camprubi-Soms, PA-C 12/26/15 2301  Blane Ohara, MD 12/27/15 1610

## 2015-12-29 ENCOUNTER — Emergency Department (HOSPITAL_COMMUNITY)
Admission: EM | Admit: 2015-12-29 | Discharge: 2015-12-29 | Disposition: A | Discharge status: Home / Self Care | Payer: Medicaid Other | Attending: Emergency Medicine | Admitting: Emergency Medicine

## 2015-12-29 ENCOUNTER — Encounter (HOSPITAL_COMMUNITY): Payer: Self-pay | Admitting: Emergency Medicine

## 2015-12-29 DIAGNOSIS — E049 Nontoxic goiter, unspecified: Secondary | ICD-10-CM | POA: Diagnosis not present

## 2015-12-29 DIAGNOSIS — R300 Dysuria: Secondary | ICD-10-CM | POA: Diagnosis not present

## 2015-12-29 DIAGNOSIS — N4889 Other specified disorders of penis: Secondary | ICD-10-CM | POA: Insufficient documentation

## 2015-12-29 DIAGNOSIS — Z8619 Personal history of other infectious and parasitic diseases: Secondary | ICD-10-CM | POA: Insufficient documentation

## 2015-12-29 LAB — URINALYSIS, ROUTINE W REFLEX MICROSCOPIC
Bilirubin Urine: NEGATIVE
Glucose, UA: NEGATIVE mg/dL
Hgb urine dipstick: NEGATIVE
Ketones, ur: NEGATIVE mg/dL
Leukocytes, UA: NEGATIVE
NITRITE: NEGATIVE
Protein, ur: NEGATIVE mg/dL
SPECIFIC GRAVITY, URINE: 1.018 (ref 1.005–1.030)
pH: 7.5 (ref 5.0–8.0)

## 2015-12-29 LAB — RAPID HIV SCREEN (HIV 1/2 AB+AG)
HIV 1/2 Antibodies: NONREACTIVE
HIV-1 P24 Antigen - HIV24: NONREACTIVE

## 2015-12-29 LAB — CULTURE, GROUP A STREP (THRC)

## 2015-12-29 MED ORDER — AZITHROMYCIN 250 MG PO TABS
1000.0000 mg | ORAL_TABLET | Freq: Once | ORAL | Status: AC
  Administered 2015-12-29: 1000 mg via ORAL
  Filled 2015-12-29: qty 4

## 2015-12-29 MED ORDER — LIDOCAINE HCL (PF) 1 % IJ SOLN
2.0000 mL | Freq: Once | INTRAMUSCULAR | Status: AC
  Administered 2015-12-29: 2 mL
  Filled 2015-12-29: qty 5

## 2015-12-29 MED ORDER — CEFTRIAXONE SODIUM 250 MG IJ SOLR
250.0000 mg | Freq: Once | INTRAMUSCULAR | Status: AC
  Administered 2015-12-29: 250 mg via INTRAMUSCULAR
  Filled 2015-12-29: qty 250

## 2015-12-29 NOTE — Discharge Instructions (Signed)
You have been seen today for penile discomfort. Lab work will take a few days to result. Any abnormal results will be called directly to you. You have been treated here in the ED for STDs just in case. Follow up with PCP as needed. Return to ED should symptoms worsen.

## 2015-12-29 NOTE — ED Notes (Signed)
Pt states he used a different type of condom on Saturday and since then he has been having redness and itching to his penis. Pt denies any discharge or abd pain.

## 2015-12-29 NOTE — ED Provider Notes (Signed)
CSN: [id number]     Arrival date & time 12/29/15  2053 History  By signing my name below, I, [name] [name], attest that this documentation has been prepared under the direction and in the presence of Skyah Hannon, PA-C. Electronically Signed: Angelene Giovanni, ED Scribe. 12/29/2015. 10:08 PM.    Chief Complaint  Patient presents with  . Penis Pain   The history is provided by the patient. No language interpreter was used.   HPI Comments: [name] [name] is a 20 y.o. male who presents to the Emergency Department complaining of ongoing unchanged redness and itchiness to his penis onset 2 days ago. He reports associated dysuria. Patient states that he thinks that the irritation may be due to the use of a new type of condom 2 days ago. No alleviating factors noted. Pt has not tried any medication PTA. He notes he has had unprotected sexual intercourse within the last month. Pt denies any fever, pain to the penis, penile discharge, scrotal pain, rash, or any other complaints.    Past Medical History  Diagnosis Date  . Febrile seizure (HCC)   . Physical growth delay   . Gynecomastia, male   . Puberty delay   . Goiter   . Thyroiditis, autoimmune    Past Surgical History  Procedure Laterality Date  . None     Family History  Problem Relation Age of Onset  . Diabetes Paternal Grandfather   . Cancer Neg Hx   . Heart disease Neg Hx    Social History  Substance Use Topics  . Smoking status: Never Smoker   . Smokeless tobacco: Never Used  . Alcohol Use: No    Review of Systems  Constitutional: Negative for fever and chills.  Genitourinary: Positive for dysuria. Negative for discharge, penile swelling, scrotal swelling, penile pain and testicular pain.       Penile redness and penile itchiness  Skin: Negative for rash.      Allergies  Review of patient's allergies indicates no known allergies.  Home Medications   Prior to Admission medications   Medication Sig Start Date End  Date Taking? Authorizing Provider  SYNTHROID 25 MCG tablet TAKE ONE TABLET BY MOUTH IN THE MORNING 12/30/15   Verneda Skill, FNP   BP 136/73 mmHg  Pulse 72  Temp(Src) 98.5 F (36.9 C) (Oral)  Resp 18  Ht  (1.651 m)  Wt 50.378 kg  BMI 18.48 kg/m2  SpO2 100% Physical Exam  Constitutional: He is oriented to person, place, and time. He appears well-developed and well-nourished. No distress.  HENT:  Head: Normocephalic and atraumatic.  Eyes: Conjunctivae and EOM are normal.  Neck: Neck supple.  Cardiovascular: Normal rate.   Pulmonary/Chest: Effort normal. No respiratory distress.  Genitourinary:  Some erythema to the glans of the penis. No penile discharge. No scrotal tenderness or swelling. No skin lesions noted. RN, Selena Batten, served as chaperone during exam.  Musculoskeletal: Normal range of motion.  Lymphadenopathy:       Right: No inguinal adenopathy present.       Left: No inguinal adenopathy present.  Neurological: He is alert and oriented to person, place, and time.  Skin: Skin is warm and dry.  Psychiatric: He has a normal mood and affect. His behavior is normal.  Nursing note and vitals reviewed.   ED Course  Procedures (including critical care time) DIAGNOSTIC STUDIES: Oxygen Saturation is 100% on RA, normal by my interpretation.    COORDINATION OF CARE: 10:07 PM- Pt advised  of plan for treatment and pt agrees. Pt will receive STD testing.    Labs Review Labs Reviewed  URINE CULTURE  RPR  RAPID HIV SCREEN (HIV 1/2 AB+AG)  URINALYSIS, ROUTINE W REFLEX MICROSCOPIC (NOT AT [facility])  GC/CHLAMYDIA PROBE AMP () NOT AT [facility]    Abagayle Klutts, PA-C has personally reviewed and evaluated these lab results as part of his medical decision-making.  Medications  cefTRIAXone (ROCEPHIN) injection 250 mg (250 mg Intramuscular Given 12/29/15 2223)  azithromycin (ZITHROMAX) tablet 1,000 mg (1,000 mg Oral Given 12/29/15 2222)  lidocaine (PF) (XYLOCAINE) 1 % injection 2 mL (2  mLs Other Given 12/29/15 2223)    MDM   Final diagnoses:  Dysuria  Penile irritation    [name] [name] presents with penile irritation the last 2 days.  Patient presentation suspicious for STD. Patient had unprotected sex within the last month. No signs of allergic reaction. Treated for gonorrhea and chlamydia here in the ED. Was notified that any abnormal lab results will be called to him. Return precautions discussed. Safe sex practices discussed. Patient voiced understanding of these instructions and agrees to the plan.   I personally performed the services described in this documentation, which was scribed in my presence. The recorded information has been reviewed and is accurate.    Anselm PancoastShawn C Xylah Early, PA-C 12/31/15 0115  Mancel BaleElliott Wentz, MD 12/31/15 1054

## 2015-12-29 NOTE — ED Notes (Signed)
Pt st's pain and redness to penis since Sat after using a condom

## 2015-12-30 ENCOUNTER — Other Ambulatory Visit: Payer: Self-pay | Admitting: "Endocrinology

## 2015-12-30 LAB — GC/CHLAMYDIA PROBE AMP (~~LOC~~) NOT AT ARMC
Chlamydia: NEGATIVE
Neisseria Gonorrhea: NEGATIVE

## 2015-12-30 LAB — RPR: RPR: NONREACTIVE

## 2015-12-31 LAB — URINE CULTURE: Culture: NO GROWTH

## 2016-01-01 DIAGNOSIS — R002 Palpitations: Secondary | ICD-10-CM

## 2016-01-06 IMAGING — CR DG CHEST 2V
2 series · 2 of 2 positions shown · non-contrast
Comparison: Radiograph dated 08/15/2015

CLINICAL DATA: 19-year-old male with chest pain upon deep breathing

EXAM:
CHEST  2 VIEW

[chest pa]
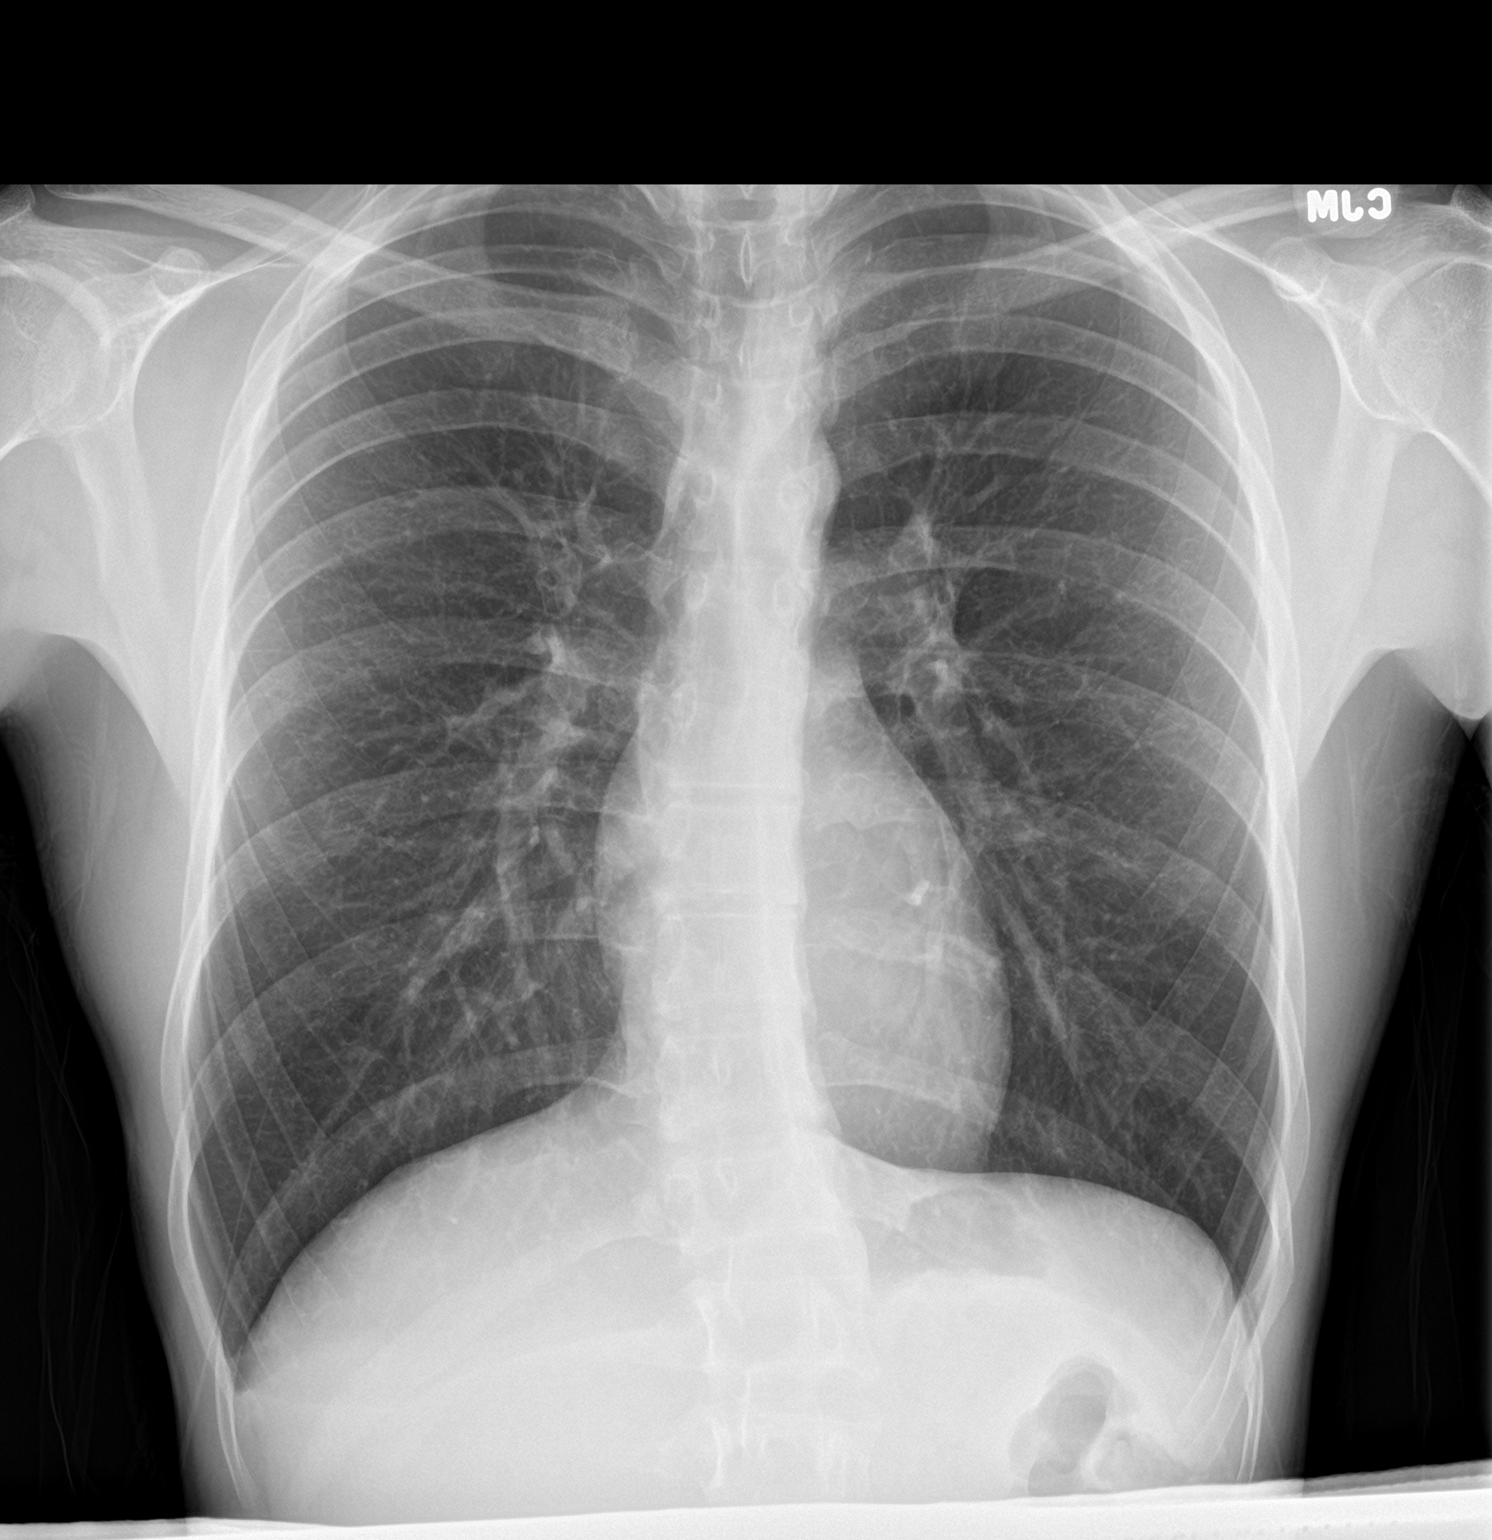

[chest lat]
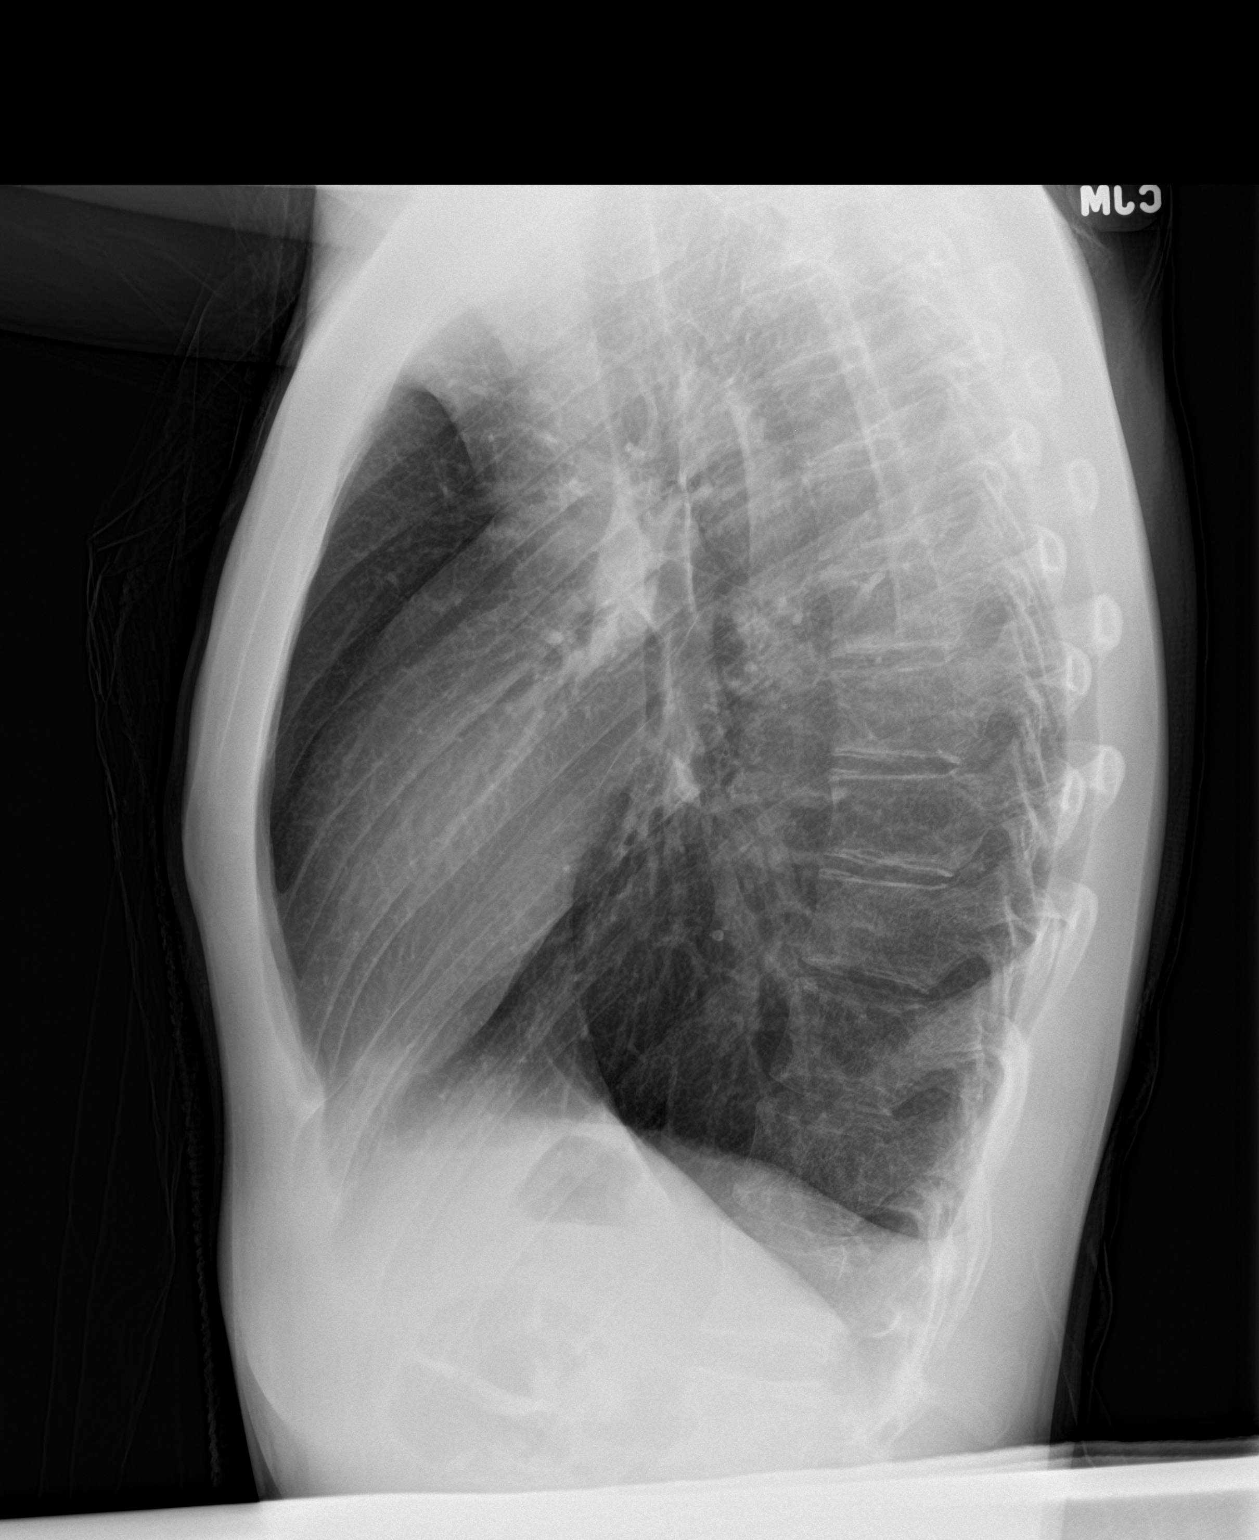

[2 of 2 positions shown; findings below may reference images not displayed]

FINDINGS: The heart size and mediastinal contours are within normal limits.
Both lungs are clear. The visualized skeletal structures are
unremarkable.
IMPRESSION: No active cardiopulmonary disease.

## 2016-01-08 ENCOUNTER — Telehealth: Payer: Self-pay

## 2016-01-08 NOTE — Telephone Encounter (Signed)
-----   Message from Thurmon FairMihai Croitoru, MD sent at 01/07/2016 12:41 PM EDT ----- No serious rhythm problems. Heart rhythm was not abnormal when he felt palpitations. Heart rate increase at times, during work, which is normal.

## 2016-01-08 NOTE — Telephone Encounter (Signed)
lmtcb

## 2016-01-25 ENCOUNTER — Other Ambulatory Visit: Payer: Self-pay

## 2016-01-25 ENCOUNTER — Emergency Department (HOSPITAL_COMMUNITY)
Admission: EM | Admit: 2016-01-25 | Discharge: 2016-01-25 | Disposition: A | Discharge status: Home / Self Care | Payer: Medicaid Other | Attending: Emergency Medicine | Admitting: Emergency Medicine

## 2016-01-25 ENCOUNTER — Encounter (HOSPITAL_COMMUNITY): Payer: Self-pay | Admitting: *Deleted

## 2016-01-25 DIAGNOSIS — Z87438 Personal history of other diseases of male genital organs: Secondary | ICD-10-CM | POA: Insufficient documentation

## 2016-01-25 DIAGNOSIS — E063 Autoimmune thyroiditis: Secondary | ICD-10-CM | POA: Insufficient documentation

## 2016-01-25 DIAGNOSIS — F419 Anxiety disorder, unspecified: Secondary | ICD-10-CM | POA: Insufficient documentation

## 2016-01-25 DIAGNOSIS — R002 Palpitations: Secondary | ICD-10-CM | POA: Diagnosis present

## 2016-01-25 DIAGNOSIS — F410 Panic disorder [episodic paroxysmal anxiety] without agoraphobia: Secondary | ICD-10-CM

## 2016-01-25 LAB — COMPREHENSIVE METABOLIC PANEL
ALT: 17 U/L (ref 17–63)
ANION GAP: 7 (ref 5–15)
AST: 24 U/L (ref 15–41)
Albumin: 4.5 g/dL (ref 3.5–5.0)
Alkaline Phosphatase: 72 U/L (ref 38–126)
BUN: 16 mg/dL (ref 6–20)
CALCIUM: 9.5 mg/dL (ref 8.9–10.3)
CHLORIDE: 104 mmol/L (ref 101–111)
CO2: 26 mmol/L (ref 22–32)
CREATININE: 0.96 mg/dL (ref 0.61–1.24)
Glucose, Bld: 110 mg/dL — ABNORMAL HIGH (ref 65–99)
Potassium: 3.6 mmol/L (ref 3.5–5.1)
SODIUM: 137 mmol/L (ref 135–145)
Total Bilirubin: 0.5 mg/dL (ref 0.3–1.2)
Total Protein: 7.4 g/dL (ref 6.5–8.1)

## 2016-01-25 LAB — CBC WITH DIFFERENTIAL/PLATELET
Basophils Absolute: 0 10*3/uL (ref 0.0–0.1)
Basophils Relative: 0 %
EOS ABS: 0 10*3/uL (ref 0.0–0.7)
EOS PCT: 0 %
HCT: 48.2 % (ref 39.0–52.0)
Hemoglobin: 16.6 g/dL (ref 13.0–17.0)
LYMPHS ABS: 1.8 10*3/uL (ref 0.7–4.0)
LYMPHS PCT: 18 %
MCH: 30.2 pg (ref 26.0–34.0)
MCHC: 34.4 g/dL (ref 30.0–36.0)
MCV: 87.6 fL (ref 78.0–100.0)
MONO ABS: 0.7 10*3/uL (ref 0.1–1.0)
MONOS PCT: 7 %
Neutro Abs: 7.8 10*3/uL — ABNORMAL HIGH (ref 1.7–7.7)
Neutrophils Relative %: 75 %
PLATELETS: 283 10*3/uL (ref 150–400)
RBC: 5.5 MIL/uL (ref 4.22–5.81)
RDW: 12.2 % (ref 11.5–15.5)
WBC: 10.4 10*3/uL (ref 4.0–10.5)

## 2016-01-25 LAB — TSH: TSH: 1.125 u[IU]/mL (ref 0.350–4.500)

## 2016-01-25 NOTE — ED Notes (Signed)
Patient presents stating he was at the movies and started feeling nervous.  Stated he felt like his hands were shaking and he was SOB

## 2016-01-25 NOTE — ED Provider Notes (Signed)
CSN: [id number]     Arrival date & time 01/25/16  2101 History   First MD Initiated Contact with Patient 01/25/16 2120     Chief Complaint  Patient presents with  . Palpitations     (Consider location/radiation/quality/duration/timing/severity/associated sxs/prior Treatment) Patient is a 20 y.o. male presenting with palpitations. The history is provided by the patient.  Palpitations Palpitations quality:  Regular Onset quality:  Sudden Duration:  20 minutes Timing:  Sporadic Progression:  Resolved Chronicity:  Recurrent (twice before in the last year) Context: anxiety   Context: not caffeine, not nicotine and not stimulant use   Relieved by:  Nothing Worsened by:  Nothing Ineffective treatments:  None tried Associated symptoms: no diaphoresis and no vomiting   Risk factors: hx of thyroid disease     Past Medical History  Diagnosis Date  . Febrile seizure (HCC)   . Physical growth delay   . Gynecomastia, male   . Puberty delay   . Goiter   . Thyroiditis, autoimmune    Past Surgical History  Procedure Laterality Date  . None     Family History  Problem Relation Age of Onset  . Diabetes Paternal Grandfather   . Cancer Neg Hx   . Heart disease Neg Hx    Social History  Substance Use Topics  . Smoking status: Never Smoker   . Smokeless tobacco: Never Used  . Alcohol Use: No    Review of Systems  Constitutional: Negative for diaphoresis.  Cardiovascular: Positive for palpitations.  Gastrointestinal: Negative for vomiting.  All other systems reviewed and are negative.     Allergies  Review of patient's allergies indicates no known allergies.  Home Medications   Prior to Admission medications   Medication Sig Start Date End Date Taking? Authorizing Provider  SYNTHROID 25 MCG tablet TAKE ONE TABLET BY MOUTH IN THE MORNING 12/30/15  Yes Verneda Skill, FNP   BP 112/66 mmHg  Pulse 64  Temp(Src) 98.3 F (36.8 C) (Oral)  Resp 13  Ht  (1.676 m)  Wt  112 lb (50.803 kg)  BMI 18.09 kg/m2  SpO2 98% Physical Exam  Constitutional: He is oriented to person, place, and time. He appears well-developed and well-nourished. No distress.  HENT:  Head: Normocephalic and atraumatic.  Eyes: Conjunctivae are normal.  Neck: Neck supple. No tracheal deviation present.  Cardiovascular: Normal rate, regular rhythm and normal heart sounds.   Pulmonary/Chest: Effort normal and breath sounds normal. No respiratory distress.  Abdominal: Soft. He exhibits no distension.  Neurological: He is alert and oriented to person, place, and time.  Skin: Skin is warm and dry.  Psychiatric: He has a normal mood and affect.  Vitals reviewed.   ED Course  Procedures (including critical care time) Labs Review Labs Reviewed  CBC WITH DIFFERENTIAL/PLATELET - Abnormal; Notable for the following:    Neutro Abs 7.8 (*)    All other components within normal limits  COMPREHENSIVE METABOLIC PANEL - Abnormal; Notable for the following:    Glucose, Bld 110 (*)    All other components within normal limits  TSH  T4    Imaging Review No results found. I have personally reviewed and evaluated these images and lab results as part of my medical decision-making.   EKG Interpretation   Date/Time:  Sunday Jan 25 2016 22:46:13 EDT Ventricular Rate:  68 PR Interval:  150 QRS Duration: 84 QT Interval:  357 QTC Calculation: 380 R Axis:   108 Text Interpretation:  Sinus rhythm  Borderline right axis deviation  Borderline Q waves in lateral leads No significant change since last  tracing Confirmed by Winifred Bodiford MD, Janeil Schexnayder [phone]([id number]) on 01/25/2016 10:48:02 PM      MDM   Final diagnoses:  Anxiety attack    20 y.o. male presents with feeling of palpitations and anxiousness while at a movie. He recently was ocnfronted by his father and thought he might be in trouble. Symptoms completely subsided shortly after starting. EKG interpreted by me without ST or T wave changes concerning  for myocardial ischemia. No delta wave, no prolonged QTc, no brugada to suggest arrhythmogenicity.  Screening TSH is normal so doubt acute thyroid dysfunction or iatrogenic hyperthyroid from medication. Appears related to mild anxiety symptoms. Plan to follow up with PCP as needed and return precautions discussed for worsening or new concerning symptoms.     Lyndal Pulleyaniel Tanza Pellot, MD 01/26/16 [phone]

## 2016-01-25 NOTE — Discharge Instructions (Signed)
Crisis de angustia  (Panic Attacks)  Las crisis de angustia son ataques repentinos y breves de ansiedad, miedo o malestar extremos. Es posible que ocurran sin motivo, cuando está relajado, ansioso o cuando duerme. Las crisis de angustia pueden ocurrir por algunas de estas razones:   · En ocasiones, las personas sanas presentan crisis de angustia en situaciones extremas, potencialmente mortales, como la guerra o los desastres naturales. La ansiedad normal es un mecanismo de defensa del cuerpo que nos ayuda a reaccionar ante situaciones de peligro (respuesta de defensa o huida).  · Con frecuencia, las crisis de angustia aparecen acompañadas de trastornos de ansiedad, como trastorno de pánico, trastorno de ansiedad social, trastorno de ansiedad generalizada y fobias. Los trastornos de ansiedad provocan ansiedad excesiva o incontrolable. Sus relaciones y actividades pueden verse afectadas.  · En ocasiones, las crisis de ansiedad se presentan con otras enfermedades mentales, como la depresión y el trastorno por estrés postraumático.  · Algunas enfermedades, medicamentos recetados y drogas pueden provocar crisis de angustia.  SÍNTOMAS   Las crisis de angustia comienzan repentinamente, alcanzan el punto máximo a los 20 minutos y se presentan junto con cuatro o más de los siguientes síntomas:  · Latidos cardíacos acelerados o frecuencia cardíaca elevada (palpitaciones).  · Sudoración.  · Temblores o sacudidas.  · Dificultad para respirar o sensación de asfixia.  · Sensación de ahogo.  · Dolor o molestias en el pecho.  · Náuseas o sensación extraña en el estómago.  · Mareos, sensación de desvanecimiento o de desmayo.  · Escalofríos o sofocos.  · Hormigueos o adormecimiento en los labios o las manos y los pies.  · Sensación de que las cosas no son reales o de que no es usted mismo.  · Temor a perder el control o el juicio.  · Temor a la muerte.  Algunos de estos síntomas pueden parecerse a enfermedades graves. Por ejemplo, es  posible que piense que tendrá un ataque cardíaco. Aunque las crisis de angustia pueden ser muy atemorizantes, no son potencialmente mortales.  DIAGNÓSTICO   Las crisis de angustia se diagnostican con una evaluación que realiza el médico. Su médico le realizará preguntas sobre los síntomas, como cuándo y dónde ocurrieron. También le preguntará sobre su historia clínica y sobre el consumo de alcohol y drogas, incluidos los medicamentos recetados. Es posible que su médico le indique análisis de sangre u otros estudios para descartar enfermedades graves. El médico podrá derivarlo a un profesional de la salud mental para que le realice una evaluación más profunda.  TRATAMIENTO   · En general, las personas sanas que registran una o dos crisis de angustia bajo una situación extrema, potencialmente mortal, no requerirán tratamiento.  · El tratamiento de las crisis de angustia asociadas con trastornos de ansiedad u otras enfermedades mentales, generalmente, requiere orientación por parte de un profesional de la salud mental medicamentos, o bien la combinación de ambos. Su médico le ayudará a determinar el mejor tratamiento para usted.  · Las crisis de angustia asociadas a enfermedades físicas, generalmente, desaparecen con el tratamiento de la enfermedad. Si un medicamento recetado le causa crisis de angustia, consulte a su médico si debe suspenderlo, disminuir la dosis o sustituirlo por otro medicamento.  · Las crisis de angustia asociadas al consumo de drogas o alcohol desaparecen con la abstinencia. Algunos adultos necesitan ayuda profesional para dejar de beber o de consumir drogas.  INSTRUCCIONES PARA EL CUIDADO EN EL HOGAR   · Tome todos los medicamentos como le indicó el   médico.  · Planifique y concurra a todas las visitas de control, según le indique el médico. Es importante que concurra a todas las visitas.  SOLICITE ATENCIÓN MÉDICA SI:  · No puede tomar los medicamentos como se lo han indicado.  · Los síntomas no  mejoran o empeoran.  SOLICITE ATENCIÓN MÉDICA DE INMEDIATO SI:   · Experimenta síntomas de crisis de angustia diferentes de los que presenta habitualmente.  · Tiene pensamientos serios acerca de lastimarse a usted mismo o dañar a otras personas.  · Toma medicamentos para las crisis de angustia y presenta efectos secundarios graves.  ASEGÚRESE DE QUE:  · Comprende estas instrucciones.  · Controlará su afección.  · Recibirá ayuda de inmediato si no mejora o si empeora.     Esta información no tiene como fin reemplazar el consejo del médico. Asegúrese de hacerle al médico cualquier pregunta que tenga.     Document Released: 08/16/2005 Document Revised: 08/21/2013  Elsevier Interactive Patient Education ©2016 [employer]

## 2016-01-25 NOTE — ED Notes (Signed)
Pt A&OX4, ambulatory at d/c with steady gait, NAD 

## 2016-01-27 LAB — T4: T4, Total: 6.9 ug/dL (ref 4.5–12.0)

## 2016-01-28 NOTE — Telephone Encounter (Signed)
Letter mailed to patient requesting a call back. 

## 2016-01-29 ENCOUNTER — Other Ambulatory Visit: Payer: Self-pay | Admitting: Pediatrics

## 2016-02-02 ENCOUNTER — Other Ambulatory Visit: Payer: Self-pay | Admitting: *Deleted

## 2016-02-02 DIAGNOSIS — E034 Atrophy of thyroid (acquired): Secondary | ICD-10-CM

## 2016-02-02 MED ORDER — LEVOTHYROXINE SODIUM 25 MCG PO TABS: 25.0000 ug | ORAL_TABLET | Freq: Every morning | ORAL | Status: DC

## 2016-03-04 NOTE — Telephone Encounter (Signed)
Detailed letter with results mailed to patient.

## 2016-04-27 ENCOUNTER — Other Ambulatory Visit: Payer: Self-pay | Admitting: *Deleted

## 2016-04-27 DIAGNOSIS — E034 Atrophy of thyroid (acquired): Secondary | ICD-10-CM

## 2016-05-20 ENCOUNTER — Encounter: Payer: Self-pay | Admitting: Family

## 2016-05-20 ENCOUNTER — Ambulatory Visit (INDEPENDENT_AMBULATORY_CARE_PROVIDER_SITE_OTHER): Payer: Self-pay | Admitting: Family

## 2016-05-20 VITALS — BP 116/64 | HR 75 | Wt 110.6 lb

## 2016-05-20 DIAGNOSIS — R634 Abnormal weight loss: Secondary | ICD-10-CM

## 2016-05-20 DIAGNOSIS — E039 Hypothyroidism, unspecified: Secondary | ICD-10-CM

## 2016-05-20 LAB — TSH: TSH: 0.62 m[IU]/L (ref 0.40–4.50)

## 2016-05-20 LAB — T4, FREE: Free T4: 1.2 ng/dL (ref 0.8–1.4)

## 2016-05-20 NOTE — Progress Notes (Signed)
Subjective:  Patient Name: [name] [name] Date of Birth: 08/30/1996  MRN: [id number]  [name] [name]  presents to the office today for follow-up evaluation and management of his acquired hypothyroidism, goiter, and thyroiditis.   HISTORY OF PRESENT ILLNESS:   [name] [name] is a 20 y.o. Hispanic young man.  Koron was unaccompanied.  1. I saw the patient for his initial consultation on 02/26/10. He had been referred by his nurse practitioner at [facility], [name], for evaluation of growth delay, puberty delay, and gynecomastia. He was 14-5/92 years old.  A. The child had had febrile seizures at about 54 months of age and was treated for two years with some medications, presumably anti-epileptic drugs. He was subsequently noted to have short stature and was evaluated at the [facility] in 2009. His screening tests for growth hormone were reportedly normal. Bone age was reportedly read as 38 at a chronologic age of 12-3/12.  Child was supposed to FU at [facility] in 2010 but did not. Gynecomastia was first noted in 2010. FH was positive for the father having pubertal delay and continuing to grow at ages 70-18. Father was reportedly 72 inches tall. Mother was 60 inches tall. Maternal grandfather was tall, but maternal grandmother and both paternal grandparents were short.    B. On physical exam the child was at about the 6% for height and at about the 8% for weight, increased from the 2% and 1% respectively in 2009. He was short and slender, but proportionate. He had a 20+ gram goiter. Areolae were Tanner stage 1 in configuration, but 19-20 mm in diameter, which was somewhat enlarged. Pubic hair was Tanner III-IV, Testes were 16-20 mL in volume. IGF-1 was 203 (normal for age and increased from 74 in 2009). CMP was normal. TFTs were low-normal. 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  He also had a goiter  and low-normal TFTs. I elected to follow him serially over time.   2. During the past six years he has progressed further through puberty and has grown in both height and weight. His goiter size and TFTs have fluctuated over time. When his TFTs in July 2013 were hypothyroid, Dr. Fransico Michael started him on Synthroid, 25 mcg/day.   3. The patient's last [facility] visit was on 11/25/15. At that visit Dr Fransico Michael continued his Synthroid dose of 25 mcg/day.   A. In the interim he reports that he has been generally healthy. He went to the ER once per his own account for anxiety. He feels like that has not been a problem since. He reports that he takes synthroid every day and rarely misses any doses. He denies hot/cold flashes, denies constipation/diarrhea, denies palpitations, denies change in energy or fatigue. He states that he has a pretty good appetite but has been very busy working lately.   4. Pertinent Review of Systems:  Constitutional: [name] [name] feels "great". His energy level is good.   Eyes: Vision seems to be good. There are no recognized eye problems. Neck: The patient has no complaints of anterior neck swelling, soreness, tenderness, pressure, discomfort, or difficulty swallowing.   Heart: He feels that his heart beats faster at times when he exercises. The patient has no other complaints of palpitations, irregular heart beats, chest pain, or chest pressure.   Gastrointestinal: Bowel movents seem normal. The patient has no complaints of excessive hunger, acid reflux, upset stomach, stomach aches or pains,  diarrhea, or constipation.  Hands: No tremor Legs: Muscle mass and strength seem normal. There are no complaints of numbness, tingling, burning, or pain. No edema is noted.  Feet: There are no obvious foot problems. There are no complaints of numbness, tingling, burning, or pain. No edema is noted. Neurologic: There are no recognized problems with muscle movement and strength, sensation, or  coordination. He can do his video games and text quite well.  Psychologic: He is "very good". Mentally: He does well in terms of paying attention, remembering  PAST MEDICAL, FAMILY, AND SOCIAL HISTORY  Past Medical History:  Diagnosis Date  . Febrile seizure (HCC)   . Goiter   . Gynecomastia, male   . Physical growth delay   . Puberty delay   . Thyroiditis, autoimmune     Family History  Problem Relation Age of Onset  . Diabetes Paternal Grandfather   . Cancer Neg Hx   . Heart disease Neg Hx      Current Outpatient Prescriptions:  .  levothyroxine (SYNTHROID) 25 MCG tablet, Take 1 tablet (25 mcg total) by mouth every morning., Disp: 30 tablet, Rfl: 6  Allergies as of 05/20/2016  . (No Known Allergies)     reports that he has never smoked. He has never used smokeless tobacco. He reports that he does not drink alcohol or use drugs. Pediatric History  Patient Guardian Status  . Mother:  BrentMaribel   Other Topics Concern  . Not on file   Social History Narrative  . No narrative on file    1. School and Family: He graduated from high school in 2016. 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 2. Activities: He has [place] [employer].      3. Primary Care Provider: None  REVIEW OF SYSTEMS: There are no other significant problems involving Arvie's other body systems.   Objective:  Vital Signs:  BP 116/64   Pulse 75   Wt 50.2 kg (110 lb 9.6 oz)   BMI 17.85 kg/m    Ht Readings from Last 3 Encounters:  01/25/16 5\' 6"  (1.676 m)  12/29/15 5\' 5"  (1.651 m)  12/26/15 5\' 6"  (1.676 m)   Wt Readings from Last 3 Encounters:  05/20/16 50.2 kg (110 lb 9.6 oz)  01/25/16 50.8 kg (112 lb)  12/29/15 50.4 kg (111 lb 1 oz)   Body surface area is 1.53 meters squared. Facility age limit for growth percentiles is 20 years. Facility age limit for growth percentiles is 20 years.  PHYSICAL EXAM:  Constitutional: The patient appears healthy, and slender. His weight has  decreased by about two pound. He is interactive and answers questions during visit.  Head: The head is normocephalic. Face: The face appears normal. There are no obvious dysmorphic features. Eyes: There is no obvious arcus or proptosis. Moisture appears normal. Mouth: The oropharynx and tongue appear normal. Dentition appears to be normal for age. Oral moisture is normal. Neck: The neck appears to be visibly normal. No carotid bruits are noted.  The thyroid gland is slightly enlarged today. His left lobe is larger than the right. The thyroid gland is not tender to palpation. Lungs: The lungs are clear to auscultation. Air movement is good. Heart: Heart rate and rhythm are regular. Heart sounds S1 and S2 are normal. I did not appreciate any pathologic cardiac murmurs. Abdomen: The abdomen is normal in size. Bowel sounds are normal. There is no obvious hepatomegaly, splenomegaly, or other mass effect.  Arms: Muscle size  and bulk are normal for age. Hands: There is no obvious tremor. Phalangeal and metacarpophalangeal joints are normal. Palmar muscles are normal for age. Palmar skin is normal. Palmar moisture is also normal. Legs: Muscles appear normal for age. No edema is present. Neurologic: Strength is normal for age in both the upper and lower extremities. Muscle tone is normal. Sensation to touch is normal in both legs.    LAB DATA:   Results for orders placed or performed in visit on 05/20/16  TSH  Result Value Ref Range   TSH 0.62 0.40 - 4.50 mIU/L  T4, free  Result Value Ref Range   Free T4 1.2 0.8 - 1.4 ng/dL  T3  Result Value Ref Range   T3, Total 94.0 86 - 192 ng/dL    1/61/093/17/17: Sodium 604140, potassium 3.4, chloride 102, CO2 26, glucose 106  10/30/14: TSH 1.307, free T4 1.28, free T3 3.9;  05/02/14: TSH 1.244, free T4 1.21, free T3 3.7  10/30/13: TSH 2.407, free T4 1.24, free T3 3.3; CBC normal; CMP normal  05/02/13: TSH 1.619, free T4 1.18, free T3 3.6  01/30/13: TSH 2.004,  free T4 1.28, free T3 3.9  07/13/12: TSH 1.809, free T4 1.15, free T3 3.5, CMP normal, IGF-1 355, testosterone 286.28  03/29/12: TSH 5.393, free T4 0.98, free T3 3.3, IGF-1 265  01/11/12: TSH 3.81, free T4 0.94, free T3 3.8, TPO <10; LH 1.4, FSH 6.9, testosterone 198.57,    Assessment and Plan:   ASSESSMENT:  1. Goiter: His thyroid is again enlarged at about the same size as at last visit. His right lobe is smaller. His left lobe may be a bit larger. The process of waxing and waning of thyroid gland size is c/w evolving Hashimoto's thyroiditis.  2. Hypothyroidism, acquired, autoimmune: TFT's were normal at last visit on his current dose of synthroid. Will need to repeat TFT today.  3. Weight loss, unintentional: Two pound weight loss. He has been working more and is physically active. Reports good appetite.     PLAN:  1. Diagnostic: Repeat TFT today  2. Therapeutic: Continue 25mcg of synthroid daily  3. Patient education: Discussed hypothyroid signs and symptoms. Discussed need to follow labs for thyroid. Discussed continuing Synthroid and needing to get labs today. .   4. Follow-up: 4 months   Level of Service: This visit lasted in excess of 15 minutes. More than 50% of the visit was devoted to counseling.  Gretchen ShortSpenser Ege Muckey FNP-C

## 2016-05-20 NOTE — Patient Instructions (Signed)
-   Continue taking of synthroid per day  - Follow up as needed.

## 2016-05-21 ENCOUNTER — Encounter: Payer: Self-pay | Admitting: Family

## 2016-05-21 ENCOUNTER — Encounter: Payer: Self-pay | Admitting: *Deleted

## 2016-05-21 LAB — T3: T3 TOTAL: 94 ng/dL (ref 86–192)

## 2016-05-27 ENCOUNTER — Ambulatory Visit: Payer: Medicaid Other | Admitting: "Endocrinology

## 2016-06-17 ENCOUNTER — Ambulatory Visit: Payer: Self-pay | Admitting: Family

## 2016-06-19 ENCOUNTER — Ambulatory Visit (INDEPENDENT_AMBULATORY_CARE_PROVIDER_SITE_OTHER): Payer: Self-pay | Admitting: Family Medicine

## 2016-06-19 VITALS — BP 102/66 | HR 77 | Temp 98.1°F | Resp 18 | Ht 66.0 in | Wt 113.0 lb

## 2016-06-19 DIAGNOSIS — N368 Other specified disorders of urethra: Secondary | ICD-10-CM

## 2016-06-19 DIAGNOSIS — L0231 Cutaneous abscess of buttock: Secondary | ICD-10-CM

## 2016-06-19 DIAGNOSIS — Z7251 High risk heterosexual behavior: Secondary | ICD-10-CM

## 2016-06-19 MED ORDER — DOXYCYCLINE HYCLATE 100 MG PO TABS: 100.0000 mg | ORAL_TABLET | Freq: Two times a day (BID) | ORAL | 0 refills | Status: DC

## 2016-06-19 NOTE — Progress Notes (Signed)
Verbal Consent Obtained. Local anesthesia with 3 cc of 2% lidocaine plain.  11 blade used to incise the lesion centrally.  Purulence expressed. Irrigated wound with 2 cc of 2% lidocaine and packed with 1/4 inch plain packing.  Cleansed and dressed.

## 2016-06-19 NOTE — Progress Notes (Signed)
Subjective:  By signing my name below, I, Stann Ore, attest that this documentation has been prepared under the direction and in the presence of Meredith Staggers, MD. Electronically Signed: Stann Ore, Scribe. 06/19/2016 , 11:51 AM .  Patient was seen in Room 9 .   Patient ID: [name] [name], male    DOB: Dec 18, 1995, 20 y.o.   MRN: [id number] Chief Complaint  Patient presents with  . Mass    on butt  . Penis Pain    redness and the tip   HPI [name] [name] is a 20 y.o. male  Here for 2 concerns today, mass on buttocks and redness over his penile tip.   Buttock Patient states he noticed bumps over his right buttock for 8 months, with a new bump noticed in past few days. He reports having pain when sitting down. He did notice some watery discharge out of the area yesterday. He did recently shave his skin around the affected area; last shaved 2 days ago, and shaves once a week.   Penile Redness Patient also mentions noticing redness over his penis tip, with watery bumps over his penis, first noticed 4 days ago. He started a new oil-based soap recently, and noticed the redness start. He did apply cortisone cream twice a day over his penis for about 3 days. His most recent sexual intercourse was a month ago, with a male; wore a condom but some with unprotected intercourse. He denies history of STD's. He was tested a year ago, which was negative. He shaves his penile area everyday. He denies penile blisters in the past. He denies penile pain.   Patient Active Problem List   Diagnosis Date Noted  . Palpitations 12/03/2015  . Inappropriate sinus tachycardia (HCC) 11/25/2015  . Nonspecific abnormal electrocardiogram (ECG) (EKG) 11/25/2015  . Hypothyroidism, acquired, autoimmune 03/29/2012  . Weight loss 03/29/2012  . Goiter   . Thyroiditis, autoimmune    Past Medical History:  Diagnosis Date  . Febrile seizure (HCC)   . Goiter   . Gynecomastia, male   . Physical growth delay     . Puberty delay   . Thyroiditis, autoimmune    Past Surgical History:  Procedure Laterality Date  . none     No Known Allergies Prior to Admission medications   Medication Sig Start Date End Date Taking? Authorizing Provider  levothyroxine (SYNTHROID) 25 MCG tablet Take 1 tablet (25 mcg total) by mouth every morning. 02/02/16  Yes Verneda Skill, FNP   Social History   Social History  . Marital status: Married    Spouse name: N/A  . Number of children: N/A  . Years of education: N/A   Occupational History  . Not on file.   Social History Main Topics  . Smoking status: Never Smoker  . Smokeless tobacco: Never Used  . Alcohol use No  . Drug use: No  . Sexual activity: Not on file   Other Topics Concern  . Not on file   Social History Narrative  . No narrative on file   Review of Systems  Constitutional: Negative for chills, fatigue and fever.  Gastrointestinal: Negative for diarrhea, nausea and vomiting.  Genitourinary: Negative for discharge, genital sores and penile pain.       Penile redness Bump over right buttock  Skin: Negative for rash and wound.       Objective:   Physical Exam  Constitutional: He is oriented to person, place, and time. He appears well-developed and well-nourished. No  distress.  HENT:  Head: Normocephalic and atraumatic.  Eyes: EOM are normal. Pupils are equal, round, and reactive to light.  Neck: Neck supple.  Cardiovascular: Normal rate.   Pulmonary/Chest: Effort normal. No respiratory distress.  Genitourinary: Uncircumcised.  Genitourinary Comments: Shaved genital hair, some erythema at the urethra meatus, but no other external rash Approximately 3~4 o'clock on the right buttock: there is a pustule that is draining white exudate; there is surrounding induration approximately 1.5~2cm with central fluctuance; 2 small scars more superior on the right buttock appears to be previous healed pustules; no apparent perianal fistula  palpated; no hemorrhoids or fissures were noted  Musculoskeletal: Normal range of motion.  Neurological: He is alert and oriented to person, place, and time.  Skin: Skin is warm and dry.  Psychiatric: He has a normal mood and affect. His behavior is normal.  Nursing note and vitals reviewed.   Vitals:   06/19/16 1108  BP: 102/66  Pulse: 77  Resp: 18  Temp: 98.1 F (36.7 C)  TempSrc: Oral  SpO2: 99%  Weight: 113 lb (51.3 kg)  Height: 5\' 6"  (1.676 m)      Assessment & Plan:   [name] [name] is a 20 y.o. male History of unprotected sex - Plan: GC/Chlamydia Probe Amp, HIV antibody, RPR  - check STI testing at his request, but no concerning rash on exam or penile discharge. Safer sex practices discussed.   Abscess of right buttock - Plan: doxycycline (VIBRA-TABS) 100 MG tablet, WOUND CULTURE  - I and D per procedure note, start doxycycline, check wound culture  - discussed avoidance of shaving affected area  - rtc precautions given  Irritation of urethral meatus - Plan: GC/Chlamydia Probe Amp, HIV antibody, RPR  - no discharge, but will check STI testing as above.   - may be contact irritant from soap based on hx.  Mild soap discussed, rinse soap well with retraction of foreskin, and rtc precautions if persistent.   Meds ordered this encounter  Medications  . doxycycline (VIBRA-TABS) 100 MG tablet    Sig: Take 1 tablet (100 mg total) by mouth 2 (two) times daily.    Dispense:  20 tablet    Refill:  0   Patient Instructions   Avoid shaving the skin around the anus as that may be causing these infections. Start antibiotic one pill twice per day for infection, and follow-up as discussed for abscess.  The irritation around the opening of the penis may be due to soap or other irritation, but with history of unprotected intercourse, I will check other sexually transmitted infections. I do not see any concerns on your exam that would indicate a sexually transmitted infection at this  time. Make sure you use condoms every time with intercourse. Use a mild soap such as Dove for men, retract foreskin of penis and make sure that is rinsed with clean water after use of soap, and if the redness or irritated area does not improve into next week, return for recheck.   Take the antibiotic as prescribed. Apply a warm compress to the area for 15-20 minutes 2-4 times each day. Change the dressing if it becomes saturated, leaks or falls off.    Abscess An abscess is an infected area that contains a collection of pus and debris.It can occur in almost any part of the body. An abscess is also known as a furuncle or boil. CAUSES  An abscess occurs when tissue gets infected. This can occur from blockage of  oil or sweat glands, infection of hair follicles, or a minor injury to the skin. As the body tries to fight the infection, pus collects in the area and creates pressure under the skin. This pressure causes pain. People with weakened immune systems have difficulty fighting infections and get certain abscesses more often.  SYMPTOMS Usually an abscess develops on the skin and becomes a painful mass that is red, warm, and tender. If the abscess forms under the skin, you may feel a moveable soft area under the skin. Some abscesses break open (rupture) on their own, but most will continue to get worse without care. The infection can spread deeper into the body and eventually into the bloodstream, causing you to feel ill.  DIAGNOSIS  Your caregiver will take your medical history and perform a physical exam. A sample of fluid may also be taken from the abscess to determine what is causing your infection. TREATMENT  Your caregiver may prescribe antibiotic medicines to fight the infection. However, taking antibiotics alone usually does not cure an abscess. Your caregiver may need to make a small cut (incision) in the abscess to drain the pus. In some cases, gauze is packed into the abscess to reduce  pain and to continue draining the area. HOME CARE INSTRUCTIONS   Only take over-the-counter or prescription medicines for pain, discomfort, or fever as directed by your caregiver.  If you were prescribed antibiotics, take them as directed. Finish them even if you start to feel better.  If gauze is used, follow your caregiver's directions for changing the gauze.  To avoid spreading the infection:  Keep your draining abscess covered with a bandage.  Wash your hands well.  Do not share personal care items, towels, or whirlpools with others.  Avoid skin contact with others.  Keep your skin and clothes clean around the abscess.  Keep all follow-up appointments as directed by your caregiver. SEEK MEDICAL CARE IF:   You have increased pain, swelling, redness, fluid drainage, or bleeding.  You have muscle aches, chills, or a general ill feeling.  You have a fever. MAKE SURE YOU:   Understand these instructions.  Will watch your condition.  Will get help right away if you are not doing well or get worse.   This information is not intended to replace advice given to you by your health care provider. Make sure you discuss any questions you have with your health care provider.   Document Released: 05/26/2005 Document Revised: 02/15/2012 Document Reviewed: 10/29/2011 Elsevier Interactive Patient Education 2016 [employer].      IF you received an x-ray today, you will receive an invoice from [facility]. Please contact [facility] at [phone] with questions or concerns regarding your invoice.   IF you received labwork today, you will receive an invoice from [employer]. Please contact [facility] at [phone] with questions or concerns regarding your invoice.   Our billing staff will not be able to assist you with questions regarding bills from these companies.  You will be contacted with the lab results as soon as they are  available. The fastest way to get your results is to activate your My Chart account. Instructions are located on the last page of this paperwork. If you have not heard from [place] regarding the results in 2 weeks, please contact this office.       I personally performed the services described in this documentation, which was scribed in my presence. The recorded information has been reviewed  and considered, and addended by me as needed.   Signed,   Meredith Staggers, MD [facility].  06/19/16 12:56 PM

## 2016-06-19 NOTE — Patient Instructions (Addendum)
Avoid shaving the skin around the anus as that may be causing these infections. Start antibiotic one pill twice per day for infection, and follow-up as discussed for abscess.  The irritation around the opening of the penis may be due to soap or other irritation, but with history of unprotected intercourse, I will check other sexually transmitted infections. I do not see any concerns on your exam that would indicate a sexually transmitted infection at this time. Make sure you use condoms every time with intercourse. Use a mild soap such as Dove for men, retract foreskin of penis and make sure that is rinsed with clean water after use of soap, and if the redness or irritated area does not improve into next week, return for recheck.   Take the antibiotic as prescribed. Apply a warm compress to the area for 15-20 minutes 2-4 times each day. Change the dressing if it becomes saturated, leaks or falls off.    Abscess An abscess is an infected area that contains a collection of pus and debris.It can occur in almost any part of the body. An abscess is also known as a furuncle or boil. CAUSES  An abscess occurs when tissue gets infected. This can occur from blockage of oil or sweat glands, infection of hair follicles, or a minor injury to the skin. As the body tries to fight the infection, pus collects in the area and creates pressure under the skin. This pressure causes pain. People with weakened immune systems have difficulty fighting infections and get certain abscesses more often.  SYMPTOMS Usually an abscess develops on the skin and becomes a painful mass that is red, warm, and tender. If the abscess forms under the skin, you may feel a moveable soft area under the skin. Some abscesses break open (rupture) on their own, but most will continue to get worse without care. The infection can spread deeper into the body and eventually into the bloodstream, causing you to feel ill.  DIAGNOSIS  Your caregiver  will take your medical history and perform a physical exam. A sample of fluid may also be taken from the abscess to determine what is causing your infection. TREATMENT  Your caregiver may prescribe antibiotic medicines to fight the infection. However, taking antibiotics alone usually does not cure an abscess. Your caregiver may need to make a small cut (incision) in the abscess to drain the pus. In some cases, gauze is packed into the abscess to reduce pain and to continue draining the area. HOME CARE INSTRUCTIONS   Only take over-the-counter or prescription medicines for pain, discomfort, or fever as directed by your caregiver.  If you were prescribed antibiotics, take them as directed. Finish them even if you start to feel better.  If gauze is used, follow your caregiver's directions for changing the gauze.  To avoid spreading the infection:  Keep your draining abscess covered with a bandage.  Wash your hands well.  Do not share personal care items, towels, or whirlpools with others.  Avoid skin contact with others.  Keep your skin and clothes clean around the abscess.  Keep all follow-up appointments as directed by your caregiver. SEEK MEDICAL CARE IF:   You have increased pain, swelling, redness, fluid drainage, or bleeding.  You have muscle aches, chills, or a general ill feeling.  You have a fever. MAKE SURE YOU:   Understand these instructions.  Will watch your condition.  Will get help right away if you are not doing well or get worse.  This information is not intended to replace advice given to you by your health care provider. Make sure you discuss any questions you have with your health care provider.   Document Released: 05/26/2005 Document Revised: 02/15/2012 Document Reviewed: 10/29/2011 Elsevier Interactive Patient Education 2016 [employer].      IF you received an x-ray today, you will receive an invoice from [facility]. Please contact  [facility] at [phone] with questions or concerns regarding your invoice.   IF you received labwork today, you will receive an invoice from [employer]. Please contact [facility] at [phone] with questions or concerns regarding your invoice.   Our billing staff will not be able to assist you with questions regarding bills from these companies.  You will be contacted with the lab results as soon as they are available. The fastest way to get your results is to activate your My Chart account. Instructions are located on the last page of this paperwork. If you have not heard from [place] regarding the results in 2 weeks, please contact this office.

## 2016-06-21 LAB — HIV ANTIBODY (ROUTINE TESTING W REFLEX): HIV 1&2 Ab, 4th Generation: NONREACTIVE

## 2016-06-22 LAB — GC/CHLAMYDIA PROBE AMP
CT Probe RNA: NOT DETECTED
GC Probe RNA: NOT DETECTED

## 2016-06-22 LAB — RPR

## 2016-06-25 LAB — WOUND CULTURE
GRAM STAIN: NONE SEEN
Gram Stain: NONE SEEN
Gram Stain: NONE SEEN

## 2016-09-21 ENCOUNTER — Encounter (INDEPENDENT_AMBULATORY_CARE_PROVIDER_SITE_OTHER): Payer: Self-pay | Admitting: *Deleted

## 2016-09-21 ENCOUNTER — Ambulatory Visit (INDEPENDENT_AMBULATORY_CARE_PROVIDER_SITE_OTHER): Payer: Medicaid Other | Admitting: Family

## 2016-09-21 ENCOUNTER — Encounter (INDEPENDENT_AMBULATORY_CARE_PROVIDER_SITE_OTHER): Payer: Self-pay | Admitting: Family

## 2016-09-21 VITALS — BP 110/58 | HR 68 | Ht 64.57 in | Wt 111.1 lb

## 2016-09-21 DIAGNOSIS — E038 Other specified hypothyroidism: Secondary | ICD-10-CM | POA: Diagnosis not present

## 2016-09-21 DIAGNOSIS — E049 Nontoxic goiter, unspecified: Secondary | ICD-10-CM

## 2016-09-21 DIAGNOSIS — E063 Autoimmune thyroiditis: Secondary | ICD-10-CM

## 2016-09-21 LAB — T3, FREE: T3, Free: 3.6 pg/mL (ref 2.3–4.2)

## 2016-09-21 LAB — T4, FREE: FREE T4: 1.2 ng/dL (ref 0.8–1.8)

## 2016-09-21 LAB — TSH: TSH: 0.86 mIU/L (ref 0.40–4.50)

## 2016-09-21 NOTE — Patient Instructions (Signed)
Continue synthroid 25 mcg per day TFT today  Follow up in 4 months

## 2016-09-21 NOTE — Progress Notes (Addendum)
Subjective:  Patient Name: [name] [name] Date of Birth: 09-25-1995  MRN: [id number]  [name] [name]  presents to the office today for follow-up evaluation and management of his acquired hypothyroidism, goiter, and thyroiditis.   HISTORY OF PRESENT ILLNESS:   [name] [name] is a 21 y.o. Hispanic young man.  [name] [name] was unaccompanied.  1. Dr Fransico Michael saw the patient for his initial consultation on 02/26/10. He had been referred by his nurse practitioner at [facility], [name], for evaluation of growth delay, puberty delay, and gynecomastia. He was 14-5/64 years old.  A. The child had had febrile seizures at about 76 months of age and was treated for two years with some medications, presumably anti-epileptic drugs. He was subsequently noted to have short stature and was evaluated at the [facility] in 2009. His screening tests for growth hormone were reportedly normal. Bone age was reportedly read as 40 at a chronologic age of 12-3/12.  Child was supposed to FU at [facility] in 2010 but did not. Gynecomastia was first noted in 2010. FH was positive for the father having pubertal delay and continuing to grow at ages 52-18. Father was reportedly 72 inches tall. Mother was 60 inches tall. Maternal grandfather was tall, but maternal grandmother and both paternal grandparents were short.    B. On physical exam the child was at about the 6% for height and at about the 8% for weight, increased from the 2% and 1% respectively in 2009. He was short and slender, but proportionate. He had a 20+ gram goiter. Areolae were Tanner stage 1 in configuration, but 19-20 mm in diameter, which was somewhat enlarged. Pubic hair was Tanner III-IV, Testes were 16-20 mL in volume. IGF-1 was 203 (normal for age and increased from 42 in 2009). CMP was normal. TFTs were low-normal. 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  He also had  a goiter and low-normal TFTs. I elected to follow him serially over time.   2. During the past six years he has progressed further through puberty and has grown in both height and weight. His goiter size and TFTs have fluctuated over time. When his TFTs in July 2013 were hypothyroid, Dr. Fransico Michael started him on Synthroid, 25 mcg/day.   3. The patient's last [facility] visit was on 05/20/16.  - Since his last visit [name] [name] reports that things have been going well. He has been working a lot lately. He reports that he has a very good appetite and has been eating a lot but that he does not gain weight. He feels like he has always been very thin. He is taking 25 mcg of Synthroid per day. He reports that he never misses any doses. He denies fatigue, cold intolerance and constipation.   4. Pertinent Review of Systems:  Constitutional: [name] [name] feels "great". His energy level is good.   Eyes: Vision seems to be good. There are no recognized eye problems. Neck: The patient has no complaints of anterior neck swelling, soreness, tenderness, pressure, discomfort, or difficulty swallowing.   Heart: He feels that his heart beats faster at times when he exercises. The patient has no other complaints of palpitations, irregular heart beats, chest pain, or chest pressure.   Gastrointestinal: Bowel movents seem normal. The patient has no complaints of excessive hunger, acid reflux, upset stomach, stomach aches or pains, diarrhea, or constipation.  Hands: No tremor Legs: Muscle mass and strength seem normal. There  are no complaints of numbness, tingling, burning, or pain. No edema is noted.  Feet: There are no obvious foot problems. There are no complaints of numbness, tingling, burning, or pain. No edema is noted. Neurologic: There are no recognized problems with muscle movement and strength, sensation, or coordination. He can do his video games and text quite well.    PAST MEDICAL, FAMILY, AND SOCIAL HISTORY  Past Medical  History:  Diagnosis Date  . Febrile seizure (HCC)   . Goiter   . Gynecomastia, male   . Physical growth delay   . Puberty delay   . Thyroiditis, autoimmune     Family History  Problem Relation Age of Onset  . Diabetes Paternal Grandfather   . Cancer Neg Hx   . Heart disease Neg Hx      Current Outpatient Prescriptions:  .  levothyroxine (SYNTHROID) 25 MCG tablet, Take 1 tablet (25 mcg total) by mouth every morning., Disp: 30 tablet, Rfl: 6 .  doxycycline (VIBRA-TABS) 100 MG tablet, Take 1 tablet (100 mg total) by mouth 2 (two) times daily. (Patient not taking: Reported on 09/21/2016), Disp: 20 tablet, Rfl: 0  Allergies as of 09/21/2016  . (No Known Allergies)     reports that he has never smoked. He has never used smokeless tobacco. He reports that he does not drink alcohol or use drugs. Pediatric History  Patient Guardian Status  . Mother:  Kramme,[name]   Other Topics Concern  . Not on file   Social History Narrative  . No narrative on file    1. School and Family: He graduated from high school in 2016. 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 2. Activities: He has [place] [employer].      3. Primary Care Provider: None  REVIEW OF SYSTEMS: There are no other significant problems involving [name] [name]'s other body systems.   Objective:  Vital Signs:  BP (!) 110/58   Pulse 68   Ht 5' 4.57" (1.64 m)   Wt 111 lb 1.6 oz (50.4 kg)   BMI 18.74 kg/m    Ht Readings from Last 3 Encounters:  09/21/16 5' 4.57" (1.64 m)  06/19/16 5\' 6"  (1.676 m)  01/25/16 5\' 6"  (1.676 m)   Wt Readings from Last 3 Encounters:  09/21/16 111 lb 1.6 oz (50.4 kg)  06/19/16 113 lb (51.3 kg)  05/20/16 110 lb 9.6 oz (50.2 kg)   Body surface area is 1.52 meters squared. Facility age limit for growth percentiles is 20 years. Facility age limit for growth percentiles is 20 years.  PHYSICAL EXAM:  Constitutional: The patient appears healthy, and very thin. His weight is stable. He is  happy and interactive.  Head: The head is normocephalic. Face: The face appears normal. There are no obvious dysmorphic features. Eyes: There is no obvious arcus or proptosis. Moisture appears normal. Mouth: The oropharynx and tongue appear normal. Dentition appears to be normal for age. Oral moisture is normal. Neck: The neck appears to be visibly normal. No carotid bruits are noted.  The thyroid gland is slightly enlarged today. His left lobe is larger than the right. The thyroid gland is not tender to palpation. Lungs: The lungs are clear to auscultation. Air movement is good. Heart: Heart rate and rhythm are regular. Heart sounds S1 and S2 are normal. I did not appreciate any pathologic cardiac murmurs. Abdomen: The abdomen is normal in size. Bowel sounds are normal. There is no obvious hepatomegaly, splenomegaly, or other mass effect.  Neurologic: Strength is normal for age in both the upper and lower extremities. Muscle tone is normal. Sensation to touch is normal in both legs.    LAB DATA:    11/14/15: Sodium 140, potassium 3.4, chloride 102, CO2 26, glucose 106  10/30/14: TSH 1.307, free T4 1.28, free T3 3.9;  05/02/14: TSH 1.244, free T4 1.21, free T3 3.7  10/30/13: TSH 2.407, free T4 1.24, free T3 3.3; CBC normal; CMP normal  05/02/13: TSH 1.619, free T4 1.18, free T3 3.6  01/30/13: TSH 2.004, free T4 1.28, free T3 3.9  07/13/12: TSH 1.809, free T4 1.15, free T3 3.5, CMP normal, IGF-1 355, testosterone 286.28  03/29/12: TSH 5.393, free T4 0.98, free T3 3.3, IGF-1 265  01/11/12: TSH 3.81, free T4 0.94, free T3 3.8, TPO <10; LH 1.4, FSH 6.9, testosterone 198.57,    Assessment and Plan:   ASSESSMENT:  1. Goiter: Thyroid gland is slightly enlarged but similar in size to his last visit. The process of waxing and waning of thyroid gland size is c/w evolving Hashimoto's thyroiditis.  2. Hypothyroidism, acquired, autoimmune: TFT's were normal at last visit on his current dose of  synthroid. He is clinically euthyroid.  Will need to repeat TFT today.  3. Weight loss, unintentional: Weight is stable since last visit. Marland Kitchen     PLAN:  1. Diagnostic: Repeat TFT today  2. Therapeutic: Continue of synthroid daily  3. Patient education: Discussed hypothyroid signs and symptoms. Discussed need to follow labs for thyroid. Discussed importance of good nutrition and taking in enough calories. Answered all questions. .   4. Follow-up: 4 months   Level of Service: This visit lasted in excess of 15 minutes. More than 50% of the visit was devoted to counseling.  Gretchen Short FNP-C

## 2016-09-27 ENCOUNTER — Other Ambulatory Visit: Payer: Self-pay | Admitting: Pediatrics

## 2016-09-27 ENCOUNTER — Other Ambulatory Visit: Payer: Self-pay | Admitting: Pediatric Endocrinology

## 2016-09-27 DIAGNOSIS — E063 Autoimmune thyroiditis: Secondary | ICD-10-CM

## 2016-09-27 MED ORDER — LEVOTHYROXINE SODIUM 25 MCG PO TABS: 25.0000 ug | ORAL_TABLET | Freq: Every morning | ORAL | 6 refills | Status: DC

## 2016-10-08 ENCOUNTER — Ambulatory Visit (INDEPENDENT_AMBULATORY_CARE_PROVIDER_SITE_OTHER): Payer: Self-pay | Admitting: Physician Assistant

## 2016-10-08 VITALS — BP 104/56 | HR 68 | Temp 98.0°F | Resp 16 | Ht 64.0 in | Wt 114.0 lb

## 2016-10-08 DIAGNOSIS — L0231 Cutaneous abscess of buttock: Secondary | ICD-10-CM

## 2016-10-08 DIAGNOSIS — B3749 Other urogenital candidiasis: Secondary | ICD-10-CM

## 2016-10-08 MED ORDER — KETOCONAZOLE 2 % EX CREA: 1.0000 "application " | TOPICAL_CREAM | Freq: Every day | CUTANEOUS | 0 refills | Status: DC

## 2016-10-08 MED ORDER — DOXYCYCLINE HYCLATE 100 MG PO TABS: 100.0000 mg | ORAL_TABLET | Freq: Two times a day (BID) | ORAL | 0 refills | Status: DC

## 2016-10-08 NOTE — Progress Notes (Signed)
     Patient ID: [name] [name], male    DOB: 14-Sep-1995, 21 y.o.   MRN: [id number]  PCP: [facility]  Chief Complaint  Patient presents with  . Cyst    buttocks    Subjective:   Presents for evaluation of a cyst on buttocks and rash on penis.  Pt is a 21 yo Hispanic male who presents with a cyst on his right buttocks x 1 year. He states that it began as a pimple and he would occasionally scratch at it. I slowly became bigger and would sometimes drain yellow fluid. 1.5 weeks ago, his father "popped" it with his fingers and it drained yellow and red fluid. He left it alone after that and it has continued to drain intermittently. It continue to be painful.   Pt also complains of rash on his penis x 2 weeks. He states that it started out as red spots that has worsened to "peeling skin and white stuff". He is currently sexually active with one male partner. He uses condoms, not every time. He has a history of STDs and exposure to STDs. Admits to occasional dysuria. Denies hematuria, frequency, or urgency.  Denies fever, chills, fatigue, other rashes, abdominal pain, nausea, vomiting, diarrhea, or constipation. Denies personal history of diabetes. PGF had diabetes.   Review of Systems See HPI   Patient Active Problem List   Diagnosis Date Noted  . Palpitations 12/03/2015  . Inappropriate sinus tachycardia (HCC) 11/25/2015  . Nonspecific abnormal electrocardiogram (ECG) (EKG) 11/25/2015  . Hypothyroidism, acquired, autoimmune 03/29/2012  . Weight loss 03/29/2012  . Goiter   . Thyroiditis, autoimmune      Prior to Admission medications   Medication Sig Start Date End Date Taking? Authorizing Provider  levothyroxine (SYNTHROID) 25 MCG tablet Take 1 tablet (25 mcg total) by mouth every morning. 09/27/16  Yes Dessa PhiJennifer Badik, MD     No Known Allergies     Objective:  Physical Exam  Genitourinary:     Skin:      Pulm: Good respiratory effort.  CTAB. No wheezes, rales, or rhonchi. CV: RRR. No M/R/G.     Assessment & Plan:   1. Abscess of right buttock Pt advised to return or see his PCP if abscess does not resolve completely. Pt advised that surgery may eventually be necessary to completely resolve the abscess. - doxycycline (VIBRA-TABS) 100 MG tablet; Take 1 tablet (100 mg total) by mouth 2 (two) times daily.  Dispense: 20 tablet; Refill: 0  2. Yeast dermatitis of penis Pt advised to keep the area clean and dry and apply cream as directed. Apply cream until antibiotic is complete.  - ketoconazole (NIZORAL) 2 % cream; Apply 1 application topically daily.  Dispense: 15 g; Refill: 0  Georgiana SpinnerHannah Bradley Manly Nestle, PA-S

## 2016-10-08 NOTE — Progress Notes (Signed)
Patient ID: [name] [name], male    DOB: 01-06-1996, 21 y.o.   MRN: [id number]  PCP: [facility]  Chief Complaint  Patient presents with  . Cyst    buttocks    Subjective:   Presents for evaluation of a cyst on the RIGHT buttock, and a rash on the penis.  The cyst has been present x 1 year, initially a "pimple" that he would periodically scratch.  Over the year, it grew in size and would episodically drain yellowish fluid. About 10 days ago, his father "popped" it and it has remained painful and draining since then.  The penile rash has been present x 2 weeks, initially presenting as "red spots" and now "peeling skin and white stuff." Sexually active with 1 male partner. Inconsistent condom use. Occasional dysuria. No hematuria, urgency or frequency. History of STI. Negative screening in 05/2016.  No fever, chills, GI symptoms, swollen or tender lymph nodes. No muscle or joint pain. No personal history of diabetes. Paternal grandfather had diabetes.    Review of Systems As above.    Patient Active Problem List   Diagnosis Date Noted  . Palpitations 12/03/2015  . Inappropriate sinus tachycardia (HCC) 11/25/2015  . Nonspecific abnormal electrocardiogram (ECG) (EKG) 11/25/2015  . Hypothyroidism, acquired, autoimmune 03/29/2012  . Weight loss 03/29/2012  . Goiter   . Thyroiditis, autoimmune      Prior to Admission medications   Medication Sig Start Date End Date Taking? Authorizing Provider  levothyroxine (SYNTHROID) 25 MCG tablet Take 1 tablet (25 mcg total) by mouth every morning. 09/27/16  Yes Dessa PhiJennifer Badik, MD     No Known Allergies     Objective:  Physical Exam  Constitutional: He is oriented to person, place, and time. He appears well-developed and well-nourished. He is active and cooperative. No distress.  BP (!) 104/56   Pulse 68   Temp 98 F (36.7 C) (Oral)   Resp 16   Ht 5\' 4"  (1.626 m)   Wt 114 lb (51.7 kg)   SpO2 99%    BMI 19.57 kg/m    Eyes: Conjunctivae are normal.  Pulmonary/Chest: Effort normal.  Abdominal: Hernia confirmed negative in the right inguinal area and confirmed negative in the left inguinal area.  Genitourinary: Testes normal.    Uncircumcised. No phimosis, paraphimosis, hypospadias, penile erythema or penile tenderness. No discharge found.  Lymphadenopathy:       Right: No inguinal adenopathy present.       Left: No inguinal adenopathy present.  Neurological: He is alert and oriented to person, place, and time.  Skin: Skin is warm and dry.     Psychiatric: He has a normal mood and affect. His speech is normal and behavior is normal.    Wt Readings from Last 3 Encounters:  10/08/16 114 lb (51.7 kg)  09/21/16 111 lb 1.6 oz (50.4 kg)  06/19/16 113 lb (51.3 kg)       Assessment & Plan:   1. Abscess of right buttock Complete antibiotic. If persists, would refer to general surgery for possible excision. - doxycycline (VIBRA-TABS) 100 MG tablet; Take 1 tablet (100 mg total) by mouth 2 (two) times daily.  Dispense: 20 tablet; Refill: 0  2. Yeast dermatitis of penis Counseled on the importance of consistent condom use. If symptoms worsen/persist, RTC for re-evaluation. - ketoconazole (NIZORAL) 2 % cream; Apply 1 application topically daily.  Dispense: 15 g; Refill: 0   Fernande Brashelle S. Jesselle Laflamme, PA-C Physician Assistant-Certified  [facility]

## 2016-10-08 NOTE — Patient Instructions (Addendum)
Use condoms EVERY time you have sex.  Use the cream on the rash twice a day until the rash is gone, then for 5 additional days. If the rash resolves before you complete the antibiotic, continue using the cream until the antibiotic is complete, then additional 5 days.  If the rash or the infection on the buttock don't go away completely, or if they go away and come right back, please return here or see your primary care provider for additional evaluation and treatment.    IF you received an x-ray today, you will receive an invoice from [facility]. Please contact [facility] at [phone] with questions or concerns regarding your invoice.   IF you received labwork today, you will receive an invoice from [facility]. Please contact [facility] at [phone] with questions or concerns regarding your invoice.   Our billing staff will not be able to assist you with questions regarding bills from these companies.  You will be contacted with the lab results as soon as they are available. The fastest way to get your results is to activate your My Chart account. Instructions are located on the last page of this paperwork. If you have not heard from us regarding the results in 2 weeks, please contact this office.

## 2016-10-15 ENCOUNTER — Other Ambulatory Visit (INDEPENDENT_AMBULATORY_CARE_PROVIDER_SITE_OTHER): Payer: Self-pay | Admitting: *Deleted

## 2016-10-15 DIAGNOSIS — E063 Autoimmune thyroiditis: Secondary | ICD-10-CM

## 2016-10-15 MED ORDER — LEVOTHYROXINE SODIUM 25 MCG PO TABS: 25.0000 ug | ORAL_TABLET | Freq: Every morning | ORAL | 6 refills | Status: DC

## 2016-10-25 ENCOUNTER — Other Ambulatory Visit (INDEPENDENT_AMBULATORY_CARE_PROVIDER_SITE_OTHER): Payer: Self-pay | Admitting: *Deleted

## 2016-10-25 DIAGNOSIS — E063 Autoimmune thyroiditis: Secondary | ICD-10-CM

## 2016-10-25 MED ORDER — LEVOTHYROXINE SODIUM 25 MCG PO TABS: 25.0000 ug | ORAL_TABLET | Freq: Every morning | ORAL | 6 refills | Status: DC

## 2017-01-19 ENCOUNTER — Ambulatory Visit (INDEPENDENT_AMBULATORY_CARE_PROVIDER_SITE_OTHER): Payer: Medicaid Other | Admitting: Family

## 2017-02-02 ENCOUNTER — Ambulatory Visit (INDEPENDENT_AMBULATORY_CARE_PROVIDER_SITE_OTHER): Payer: Self-pay | Admitting: Family

## 2017-02-02 ENCOUNTER — Encounter (INDEPENDENT_AMBULATORY_CARE_PROVIDER_SITE_OTHER): Payer: Self-pay | Admitting: Family

## 2017-02-02 VITALS — BP 122/70 | HR 68 | Wt 111.2 lb

## 2017-02-02 DIAGNOSIS — E063 Autoimmune thyroiditis: Secondary | ICD-10-CM

## 2017-02-02 DIAGNOSIS — R634 Abnormal weight loss: Secondary | ICD-10-CM

## 2017-02-02 NOTE — Progress Notes (Signed)
Subjective:  Patient Name: [name] [name] Date of Birth: 09-Nov-1995  MRN: [id number]  [name] [name]  presents to the office today for follow-up evaluation and management of his acquired hypothyroidism, goiter, and thyroiditis.   HISTORY OF PRESENT ILLNESS:   [name] is a 21 y.o. Hispanic young man.  [name] was unaccompanied.  1. Dr Fransico Michael saw the patient for his initial consultation on 02/26/10. He had been referred by his nurse practitioner at [facility], [name], for evaluation of growth delay, puberty delay, and gynecomastia. He was 14-5/37 years old.  A. The child had had febrile seizures at about 70 months of age and was treated for two years with some medications, presumably anti-epileptic drugs. He was subsequently noted to have short stature and was evaluated at the [facility] at [facility] in 2009. His screening tests for growth hormone were reportedly normal. Bone age was reportedly read as 41 at a chronologic age of 12-3/12.  Child was supposed to FU at [facility] in 2010 but did not. Gynecomastia was first noted in 2010. FH was positive for the father having pubertal delay and continuing to grow at ages 88-18. Father was reportedly 72 inches tall. Mother was 60 inches tall. Maternal grandfather was tall, but maternal grandmother and both paternal grandparents were short.    B. On physical exam the child was at about the 6% for height and at about the 8% for weight, increased from the 2% and 1% respectively in 2009. He was short and slender, but proportionate. He had a 20+ gram goiter. Areolae were Tanner stage 1 in configuration, but 19-20 mm in diameter, which was somewhat enlarged. Pubic hair was Tanner III-IV, Testes were 16-20 mL in volume. IGF-1 was 203 (normal for age and increased from 36 in 2009). CMP was normal. TFTs were low-normal. 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  He also had  a goiter and low-normal TFTs. I elected to follow him serially over time.   2. During the past six years he has progressed further through puberty and has grown in both height and weight. His goiter size and TFTs have fluctuated over time. When his TFTs in July 2013 were hypothyroid, Dr. Fransico Michael started him on Synthroid, 25 mcg/day.   3. The patient's last [facility] visit was on 05/20/16.  [name] is doing well. 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 He reports that he is taking 25 mcg of Synthroid every morning, he denies any missed doses. He denies fatigue, constipation and cold intolerance. He will have his labs drawn today before he leaves. Trek reports that his appetite his fine, he is aware he has lost 3 pounds but he is not concerned. He states "I eat when I am hungry".     4. Pertinent Review of Systems:  Constitutional: [name] [name] feels "great". His energy level is good.   Eyes: Vision seems to be good. There are no recognized eye problems. Neck: The patient has no complaints of anterior neck swelling, soreness, tenderness, pressure, discomfort, or difficulty swallowing.   Heart: He feels that his heart beats faster at times when he exercises. The patient has no other complaints of palpitations, irregular heart beats, chest pain, or chest pressure.   Gastrointestinal: Bowel movents seem normal. The patient has no complaints of excessive hunger, acid reflux, upset stomach, stomach aches or pains, diarrhea, or constipation.  Hands: No tremor Legs: Muscle mass  and strength seem normal. There are no complaints of numbness, tingling, burning, or pain. No edema is noted.  Feet: There are no obvious foot problems. There are no complaints of numbness, tingling, burning, or pain. No edema is noted. Neurologic: There are no recognized problems with muscle movement and strength, sensation, or coordination.    PAST MEDICAL, FAMILY, AND SOCIAL HISTORY  Past Medical History:  Diagnosis  Date  . Febrile seizure (HCC)   . Goiter   . Gynecomastia, male   . Physical growth delay   . Puberty delay   . Thyroiditis, autoimmune     Family History  Problem Relation Age of Onset  . Diabetes Paternal Grandfather   . Cancer Neg Hx   . Heart disease Neg Hx      Current Outpatient Prescriptions:  .  levothyroxine (SYNTHROID) 25 MCG tablet, Take 1 tablet (25 mcg total) by mouth every morning., Disp: 30 tablet, Rfl: 6 .  doxycycline (VIBRA-TABS) 100 MG tablet, Take 1 tablet (100 mg total) by mouth 2 (two) times daily. (Patient not taking: Reported on 02/02/2017), Disp: 20 tablet, Rfl: 0 .  ketoconazole (NIZORAL) 2 % cream, Apply 1 application topically daily. (Patient not taking: Reported on 02/02/2017), Disp: 15 g, Rfl: 0  Allergies as of 02/02/2017  . (No Known Allergies)     reports that he has never smoked. He has never used smokeless tobacco. He reports that he does not drink alcohol or use drugs. Pediatric History  Patient Guardian Status  . Not on file.   Other Topics Concern  . Not on file   Social History Narrative  . No narrative on file    1. School and Family: He graduated from high school in 2016. 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      3. Primary Care Provider: None  REVIEW OF SYSTEMS: There are no other significant problems involving [name]'s other body systems.   Objective:  Vital Signs:  BP 122/70   Pulse 68   Wt 111 lb 3.2 oz (50.4 kg)   BMI 19.09 kg/m    Ht Readings from Last 3 Encounters:  10/08/16 5\' 4"  (1.626 m)  09/21/16 5' 4.57" (1.64 m)  06/19/16 5\' 6"  (1.676 m)   Wt Readings from Last 3 Encounters:  02/02/17 111 lb 3.2 oz (50.4 kg)  10/08/16 114 lb (51.7 kg)  09/21/16 111 lb 1.6 oz (50.4 kg)   Body surface area is 1.51 meters squared. Facility age limit for growth percentiles is 20 years. Facility age limit for growth percentiles is 20 years.  PHYSICAL EXAM:  Constitutional: The  patient appears healthy, and very thin. He has lost 3 pounds since last visit.  Head: The head is normocephalic. Face: The face appears normal. There are no obvious dysmorphic features. Eyes: There is no obvious arcus or proptosis. Moisture appears normal. Mouth: The oropharynx and tongue appear normal. Dentition appears to be normal for age. Oral moisture is normal. Neck: The neck appears to be visibly normal. No carotid bruits are noted.  The thyroid gland is normal in size. The thyroid gland is not tender to palpation. Lungs: The lungs are clear to auscultation. Air movement is good. Heart: Heart rate and rhythm are regular. Heart sounds S1 and S2 are normal. I did not appreciate any pathologic cardiac murmurs. Abdomen: The abdomen is normal in size. Bowel sounds are normal. There is no obvious hepatomegaly, splenomegaly, or other mass effect.  Neurologic:  Strength is normal for age in both the upper and lower extremities. Muscle tone is normal. Sensation to touch is normal in both legs.    Labs:   TFT's today     Assessment and Plan:   ASSESSMENT:  1. Goiter: Thyroid gland is normal in size today.  2. Hypothyroidism, acquired, autoimmune:  He is clinically euthyroid.  Will need to repeat TFT today.  3. Weight loss, unintentional: Slight weight loss. Likely due to increased activity with painting.     PLAN:  1. Diagnostic: Repeat TFT today  2. Therapeutic: Continue 25mcg of synthroid daily  3. Patient education: Discussed hypothyroid signs and symptoms. Discussed need to follow labs for thyroid. Discussed importance of good nutrition and taking in enough calories. Answered all questions. .   4. Follow-up: 4 months   Level of Service: This visit lasted in excess of 15 minutes. More than 50% of the visit was devoted to counseling.  Gretchen ShortSpenser Dreama Kuna FNP-C

## 2017-02-02 NOTE — Patient Instructions (Signed)
TFTs today  Continue 25 mcg of synthroid  Follow up in 4 months

## 2017-02-03 LAB — TSH: TSH: 0.73 mIU/L (ref 0.40–4.50)

## 2017-02-03 LAB — T3, FREE: T3 FREE: 3.8 pg/mL (ref 2.3–4.2)

## 2017-02-03 LAB — T4, FREE: FREE T4: 1.4 ng/dL (ref 0.8–1.8)

## 2017-06-10 ENCOUNTER — Ambulatory Visit (INDEPENDENT_AMBULATORY_CARE_PROVIDER_SITE_OTHER): Payer: Self-pay | Admitting: Family

## 2017-06-14 ENCOUNTER — Encounter (INDEPENDENT_AMBULATORY_CARE_PROVIDER_SITE_OTHER): Payer: Self-pay | Admitting: Family

## 2017-06-14 ENCOUNTER — Ambulatory Visit (INDEPENDENT_AMBULATORY_CARE_PROVIDER_SITE_OTHER): Payer: Self-pay | Admitting: Family

## 2017-06-14 VITALS — BP 100/70 | HR 68 | Wt 110.2 lb

## 2017-06-14 DIAGNOSIS — E049 Nontoxic goiter, unspecified: Secondary | ICD-10-CM

## 2017-06-14 DIAGNOSIS — E063 Autoimmune thyroiditis: Secondary | ICD-10-CM

## 2017-06-14 NOTE — Progress Notes (Signed)
Subjective:  Patient Name: [name] [name] Date of Birth: Mar 23, 1996  MRN: [id number]  [name] [name]  presents to the office today for follow-up evaluation and management of his acquired hypothyroidism, goiter, and thyroiditis.   HISTORY OF PRESENT ILLNESS:   [name] [name] is a 21 y.o. Hispanic young man.  [name] [name] was unaccompanied.  1. Dr Fransico Michael saw the patient for his initial consultation on 02/26/10. [name] [name] had been referred by his nurse practitioner at [facility], [name], for evaluation of growth delay, puberty delay, and gynecomastia. [name] [name] was 14-5/21 years old.  A. The [name] [name] had had febrile seizures at about 23 months of age and was treated for two years with some medications, presumably anti-epileptic drugs. [name] [name] was subsequently noted to have short stature and was evaluated at the [facility] at [facility] in 2009. His screening tests for growth hormone were reportedly normal. Bone age was reportedly read as 45 at a chronologic age of 12-3/12.  [name] [name] was supposed to FU at [facility] in 2010 but did not. Gynecomastia was first noted in 2010. FH was positive for the father having pubertal delay and continuing to grow at ages 62-18. Father was reportedly 72 inches tall. Mother was 60 inches tall. Maternal grandfather was tall, but maternal grandmother and both paternal grandparents were short.    B. On physical exam the [name] [name] was at about the 6% for height and at about the 8% for weight, increased from the 2% and 1% respectively in 2009. [name] [name] was short and slender, but proportionate. [name] [name] had a 20+ gram goiter. Areolae were Tanner stage 1 in configuration, but 19-20 mm in diameter, which was somewhat enlarged. Pubic hair was Tanner III-IV, Testes were 16-20 mL in volume. IGF-1 was 203 (normal for age and increased from 23 in 2009). CMP was normal. TFTs were low-normal. 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  [name] [name] also had  a goiter and low-normal TFTs. I elected to follow him serially over time.   2. During the past six years [name] [name] has progressed further through puberty and has grown in both height and weight. His goiter size and TFTs have fluctuated over time. When his TFTs in July 2013 were hypothyroid, Dr. Fransico Michael started him on Synthroid, 25 mcg/day.   3. The patient's last [facility] visit was on 08/2016  [name] [name] is doing well since his last visit. 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 [name] [name] is taking 25 mcg of Synthroid per day, [name] [name] does not miss any doses. [name] [name] denies fatigue, constipation and cold intolerance. [name] [name] has a good appetite and reports that [name] [name] is eating well.   4. Pertinent Review of Systems:  Review of Systems  Constitutional: Negative for malaise/fatigue and weight loss.  Eyes: Negative for blurred vision and photophobia.  Respiratory: Negative for cough and wheezing.   Cardiovascular: Negative for chest pain and palpitations.  Gastrointestinal: Negative for abdominal pain, constipation, diarrhea, nausea and vomiting.  Genitourinary: Negative for frequency and urgency.  Musculoskeletal: Negative for neck pain.  Skin: Negative.   Neurological: Negative for dizziness, tremors, weakness and headaches.  Endo/Heme/Allergies: Negative for polydipsia.  Psychiatric/Behavioral: Negative for depression. The patient is not nervous/anxious.      PAST MEDICAL, FAMILY, AND SOCIAL HISTORY  Past Medical History:  Diagnosis Date  . Febrile seizure (HCC)   . Goiter   . Gynecomastia, male   . Physical growth delay   . Puberty  delay   . Thyroiditis, autoimmune     Family History  Problem Relation Age of Onset  . Diabetes Paternal Grandfather   . Cancer Neg Hx   . Heart disease Neg Hx      Current Outpatient Prescriptions:  .  levothyroxine (SYNTHROID) 25 MCG tablet, Take 1 tablet (25 mcg total) by mouth every morning., Disp: 30 tablet, Rfl: 6 .  doxycycline (VIBRA-TABS) 100 MG tablet,  Take 1 tablet (100 mg total) by mouth 2 (two) times daily. (Patient not taking: Reported on 02/02/2017), Disp: 20 tablet, Rfl: 0 .  ketoconazole (NIZORAL) 2 % cream, Apply 1 application topically daily. (Patient not taking: Reported on 02/02/2017), Disp: 15 g, Rfl: 0  Allergies as of 06/14/2017  . (No Known Allergies)     reports that [name] [name] has never smoked. [name] [name] has never used smokeless tobacco. [name] [name] reports that [name] [name] does not drink alcohol or use drugs. Pediatric History  Patient Guardian Status  . Not on file.   Other Topics Concern  . Not on file   Social History Narrative  . No narrative on file    1. School and Family: [name] [name] graduated from high school in 2016. 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 2. Activities: video games.      3. Primary Care Provider: None  REVIEW OF SYSTEMS: There are no other significant problems involving [name] [name]'s other body systems.   Objective:  Vital Signs:  BP 100/70   Pulse 68   Wt 110 lb 3.2 oz (50 kg)   BMI 18.92 kg/m    Ht Readings from Last 3 Encounters:  10/08/16  (1.626 m)  09/21/16 5' 4.57" (1.64 m)  06/19/16  (1.676 m)   Wt Readings from Last 3 Encounters:  06/14/17 110 lb 3.2 oz (50 kg)  02/02/17 111 lb 3.2 oz (50.4 kg)  10/08/16 114 lb (51.7 kg)   Body surface area is 1.5 meters squared. Facility age limit for growth percentiles is 20 years. Facility age limit for growth percentiles is 20 years.  PHYSICAL EXAM:  General: Well developed, well nourished male in no acute distress.  Appears stated age. [name] [name] is alert and oriented.  Head: Normocephalic, atraumatic.   Eyes:  Pupils equal and round. EOMI.  Sclera white.  No eye drainage.   Ears/Nose/Mouth/Throat: Nares patent, no nasal drainage.  Normal dentition, mucous membranes moist.  Oropharynx intact. Neck: supple, no cervical lymphadenopathy, Thyroid is soft and non tender.  Cardiovascular: regular rate, normal S1/S2, no murmurs Respiratory: No increased work of  breathing.  Lungs clear to auscultation bilaterally.  No wheezes. Abdomen: soft, nontender, nondistended. Normal bowel sounds.  No appreciable masses  Extremities: warm, well perfused, cap refill < 2 sec.   Musculoskeletal: Normal muscle mass.  Normal strength Skin: warm, dry.  No rash or lesions. Neurologic: alert and oriented, normal speech and gait   Labs: TFTs today     Assessment and Plan:   ASSESSMENT:  1. Goiter: Thyroid is normal in size and consistency today.  2. Hypothyroidism, acquired, autoimmune:  [name] [name] is clinically and chemically euthyroid on 25 mcg of Synthroid.  3. Weight loss, unintentional: Weight is stable on 25 mcg of Synthroid.     PLAN:  1. Diagnostic: TSH and Free T4 today.  2. Therapeutic: Take 25 mcg of Synthroid per day.  3. Patient education: Reviewed growth chart with patient. Discussed importance of taking Synthroid every day. If [name] [name] misses 1 dose, take 2 the following day  but can only do that one time. Discussed signs and symptoms of both hypothyroid and hyperthyroid. Reviewed healthy diet and encouraged to increase his calories. Answered questions.    4. Follow-up: 6 months.   Level of Service:  This visit lasted >15 minutes. More then 50% is devoted to counseling.  Gretchen Short FNP-C

## 2017-06-14 NOTE — Patient Instructions (Addendum)
-   Continue 25 mcg of synthroid  - Labs today  - follow up in 6 months.

## 2017-11-09 ENCOUNTER — Other Ambulatory Visit (INDEPENDENT_AMBULATORY_CARE_PROVIDER_SITE_OTHER): Payer: Self-pay | Admitting: Family

## 2017-11-09 DIAGNOSIS — E063 Autoimmune thyroiditis: Secondary | ICD-10-CM

## 2017-11-10 NOTE — Telephone Encounter (Addendum)
Need to speak with Spenser and see if we are keeping this patient in house, or referring patient to an Adult endocrinologist. Will contact patient after speaking with Spenser.   Spoke with VF CorporationSpenser, and he states we will keep that patient on until he is [id number]. Refill is sent to pharmacy. Attempted to contact patient to schedule a 6 month follow up, but did not reach. Will put a note in the pharmacy comments that patient needs to schedule a follow up.

## 2017-12-13 ENCOUNTER — Encounter (INDEPENDENT_AMBULATORY_CARE_PROVIDER_SITE_OTHER): Payer: Self-pay | Admitting: Family

## 2017-12-13 ENCOUNTER — Ambulatory Visit (INDEPENDENT_AMBULATORY_CARE_PROVIDER_SITE_OTHER): Payer: Self-pay | Admitting: Family

## 2017-12-13 VITALS — BP 98/56 | HR 92 | Ht 64.57 in | Wt 113.0 lb

## 2017-12-13 DIAGNOSIS — E063 Autoimmune thyroiditis: Secondary | ICD-10-CM

## 2017-12-13 DIAGNOSIS — E049 Nontoxic goiter, unspecified: Secondary | ICD-10-CM

## 2017-12-13 NOTE — Patient Instructions (Signed)
Continue levothyroxine  Labs today  Follow up in 6 months.  

## 2017-12-13 NOTE — Progress Notes (Signed)
Subjective:  Patient Name: [name] [name] Date of Birth: July 30, 1996  MRN: [id number]  [name] [name]  presents to the office today for follow-up evaluation and management of his acquired hypothyroidism, goiter, and thyroiditis.   HISTORY OF PRESENT ILLNESS:   [name] is a 22 y.o. Hispanic young man.  [name] [name] was unaccompanied.  1. Dr Fransico Michael saw the patient for his initial consultation on 02/26/10. He had been referred by his nurse practitioner at [facility], [name], for evaluation of growth delay, puberty delay, and gynecomastia. He was 14-5/62 years old.  A. The child had had febrile seizures at about 78 months of age and was treated for two years with some medications, presumably anti-epileptic drugs. He was subsequently noted to have short stature and was evaluated at the [facility] in 2009. His screening tests for growth hormone were reportedly normal. Bone age was reportedly read as 46 at a chronologic age of 12-3/12.  Child was supposed to FU at [facility] in 2010 but did not. Gynecomastia was first noted in 2010. FH was positive for the father having pubertal delay and continuing to grow at ages 67-18. Father was reportedly 72 inches [name]. Mother was 60 inches [name]. Maternal grandfather was [name], but maternal grandmother and both paternal grandparents were short.    B. On physical exam the child was at about the 6% for height and at about the 8% for weight, increased from the 2% and 1% respectively in 2009. He was short and slender, but proportionate. He had a 20+ gram goiter. Areolae were Tanner stage 1 in configuration, but 19-20 mm in diameter, which was somewhat enlarged. Pubic hair was Tanner III-IV, Testes were 16-20 mL in volume. IGF-1 was 203 (normal for age and increased from 53 in 2009). CMP was normal. TFTs were low-normal. 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  He also had  a goiter and low-normal TFTs. I elected to follow him serially over time.   2. During the past six years he has progressed further through puberty and has grown in both height and weight. His goiter size and TFTs have fluctuated over time. When his TFTs in July 2013 were hypothyroid, Dr. Fransico Michael started him on Synthroid, 25 mcg/day.   3. The patient's last [facility] visit was on 05/2017  [name] is doing pretty good. 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 He is taking 25 mcg of Levothyroxine per day, he denies missed doses. He denies fatigue, Constipation and cold intolerance. He feels like his appetite his improved now that he is working more.    4. Pertinent Review of Systems:  Review of Systems  Constitutional: Negative for malaise/fatigue and weight loss.  Eyes: Negative for blurred vision and photophobia.  Respiratory: Negative for cough and wheezing.   Cardiovascular: Negative for chest pain and palpitations.  Gastrointestinal: Negative for abdominal pain, constipation, diarrhea, nausea and vomiting.  Genitourinary: Negative for frequency and urgency.  Musculoskeletal: Negative for neck pain.  Skin: Negative.   Neurological: Negative for dizziness, tremors, weakness and headaches.  Endo/Heme/Allergies: Negative for polydipsia.  Psychiatric/Behavioral: Negative for depression. The patient is not nervous/anxious.      PAST MEDICAL, FAMILY, AND SOCIAL HISTORY  Past Medical History:  Diagnosis Date  . Febrile seizure (HCC)   . Goiter   . Gynecomastia, male   . Physical growth delay   . Puberty delay   .  Thyroiditis, autoimmune     Family History  Problem Relation Age of Onset  . Diabetes Paternal Grandfather   . Cancer Neg Hx   . Heart disease Neg Hx      Current Outpatient Medications:  .  doxycycline (VIBRA-TABS) 100 MG tablet, Take 1 tablet (100 mg total) by mouth 2 (two) times daily., Disp: 20 tablet, Rfl: 0 .  ketoconazole (NIZORAL) 2 % cream,  Apply 1 application topically daily., Disp: 15 g, Rfl: 0 .  levothyroxine (SYNTHROID) 25 MCG tablet, Take 1 tablet (25 mcg total) by mouth every morning., Disp: 30 tablet, Rfl: 6 .  levothyroxine (SYNTHROID, LEVOTHROID) 25 MCG tablet, TAKE 1 TABLET BY MOUTH IN THE MORNING, Disp: 30 tablet, Rfl: 5  Allergies as of 12/13/2017  . (No Known Allergies)     reports that he has never smoked. He has never used smokeless tobacco. He reports that he does not drink alcohol or use drugs. Pediatric History  Patient Guardian Status  . Mother:  Encisco,[name]   Other Topics Concern  . Not on file  Social History Narrative  . Not on file    1. School and Family: He graduated from high school in 2016. 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      3. Primary Care Provider: None  REVIEW OF SYSTEMS: There are no other significant problems involving [name] [name]'s other body systems.   Objective:  Vital Signs:  BP (!) 98/56   Pulse 92   Ht 5' 4.57" (1.64 m)   Wt 113 lb (51.3 kg)   BMI 19.06 kg/m    Ht Readings from Last 3 Encounters:  12/13/17 5' 4.57" (1.64 m)  10/08/16 5\' 4"  (1.626 m)  09/21/16 5' 4.57" (1.64 m)   Wt Readings from Last 3 Encounters:  12/13/17 113 lb (51.3 kg)  06/14/17 110 lb 3.2 oz (50 kg)  02/02/17 111 lb 3.2 oz (50.4 kg)   Body surface area is 1.53 meters squared. Facility age limit for growth percentiles is 20 years. Facility age limit for growth percentiles is 20 years.  PHYSICAL EXAM:  General: Well developed, well nourished male in no acute distress.  He is alert and oriented.  Head: Normocephalic, atraumatic.   Eyes:  Pupils equal and round. EOMI.  Sclera white.  No eye drainage.   Ears/Nose/Mouth/Throat: Nares patent, no nasal drainage.  Normal dentition, mucous membranes moist.  Oropharynx intact. Neck: supple, no cervical lymphadenopathy, + thyromegaly. No nodules or tenderness.  Cardiovascular: regular rate, normal  S1/S2, no murmurs Respiratory: No increased work of breathing.  Lungs clear to auscultation bilaterally.  No wheezes. Abdomen: soft, nontender, nondistended. Normal bowel sounds.  No appreciable masses  Extremities: warm, well perfused, cap refill < 2 sec.   Musculoskeletal: Normal muscle mass.  Normal strength Skin: warm, dry.  No rash or lesions.  Neurologic: alert and oriented, normal speech   Labs: TFTs today     Assessment and Plan:   ASSESSMENT:  1. Goiter: Thyroid glad is mildly enlarged. 2. Hypothyroidism, acquired, autoimmune:  Clinically euthyroid on 25 mcg of Levothyroxine per day. Needs TFT's repeated today.      PLAN:  1. Diagnostic: TSH and Free T4 ordered.  2. Therapeutic: 25 mcg of Levothyroxine per day.  3. Patient education: Discussed all of the above in detail with Anastasio. Answered questions.    4. Follow-up: 6 months.   Level of Service:  This visit lasted >15 minutes. More then 50%  of the visit was devoted to counseling.    Gretchen ShortSpenser Afrah Burlison,  FNP-C  Pediatric Specialist  [address]  [place], [id number]  Tele: [phone]

## 2018-06-14 ENCOUNTER — Ambulatory Visit (INDEPENDENT_AMBULATORY_CARE_PROVIDER_SITE_OTHER): Payer: Medicaid Other | Admitting: Family

## 2018-06-19 ENCOUNTER — Other Ambulatory Visit (INDEPENDENT_AMBULATORY_CARE_PROVIDER_SITE_OTHER): Payer: Self-pay | Admitting: Family

## 2018-06-19 DIAGNOSIS — E063 Autoimmune thyroiditis: Secondary | ICD-10-CM

## 2018-06-27 ENCOUNTER — Encounter (INDEPENDENT_AMBULATORY_CARE_PROVIDER_SITE_OTHER): Payer: Self-pay | Admitting: Family

## 2018-06-27 ENCOUNTER — Ambulatory Visit (INDEPENDENT_AMBULATORY_CARE_PROVIDER_SITE_OTHER): Payer: Self-pay | Admitting: Family

## 2018-06-27 VITALS — BP 106/54 | HR 60 | Ht 64.37 in | Wt 110.0 lb

## 2018-06-27 DIAGNOSIS — E063 Autoimmune thyroiditis: Secondary | ICD-10-CM

## 2018-06-27 DIAGNOSIS — E049 Nontoxic goiter, unspecified: Secondary | ICD-10-CM

## 2018-06-27 LAB — TSH: TSH: 1.28 m[IU]/L (ref 0.40–4.50)

## 2018-06-27 LAB — T4, FREE: FREE T4: 1.3 ng/dL (ref 0.8–1.8)

## 2018-06-27 NOTE — Patient Instructions (Signed)
Continue synthroid  Labs today  6 months follow up

## 2018-06-27 NOTE — Progress Notes (Signed)
Subjective:  Patient Name: [name] [name] Date of Birth: 07-May-1996  MRN: [id number]  [name] [name]  presents to the office today for follow-up evaluation and management of his acquired hypothyroidism, goiter, and thyroiditis.   HISTORY OF PRESENT ILLNESS:   [name] [name] is a 22 y.o. Hispanic young man.  [name] [name] was unaccompanied.  1. Dr Fransico Michael saw the patient for his initial consultation on 02/26/10. He had been referred by his nurse practitioner at [facility], [name], for evaluation of growth delay, puberty delay, and gynecomastia. He was 14-5/2 years old.  A. The child had had febrile seizures at about 4 months of age and was treated for two years with some medications, presumably anti-epileptic drugs. He was subsequently noted to have short stature and was evaluated at the [facility] at [facility] in 2009. His screening tests for growth hormone were reportedly normal. Bone age was reportedly read as 44 at a chronologic age of 12-3/12.  Child was supposed to FU at [facility] in 2010 but did not. Gynecomastia was first noted in 2010. FH was positive for the father having pubertal delay and continuing to grow at ages 55-18. Father was reportedly 72 inches tall. Mother was 60 inches tall. Maternal grandfather was tall, but maternal grandmother and both paternal grandparents were short.    B. On physical exam the child was at about the 6% for height and at about the 8% for weight, increased from the 2% and 1% respectively in 2009. He was short and slender, but proportionate. He had a 20+ gram goiter. Areolae were Tanner stage 1 in configuration, but 19-20 mm in diameter, which was somewhat enlarged. Pubic hair was Tanner III-IV, Testes were 16-20 mL in volume. IGF-1 was 203 (normal for age and increased from 57 in 2009). CMP was normal. TFTs were low-normal. 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  He also had  a goiter and low-normal TFTs. I elected to follow him serially over time.   2. During the past six years he has progressed further through puberty and has grown in both height and weight. His goiter size and TFTs have fluctuated over time. When his TFTs in July 2013 were hypothyroid, Dr. Fransico Michael started him on Synthroid, 25 mcg/day.   3. The patient's last [facility] visit was on 11/2017. Since that time no ER visit or hospitalizations.   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 He feels like his appetite has been good and he is eating well. He is taking 25 mcg of levothyroxine per day, denies missed doses. He denies fatigue, constipation and cold intolerance.      4. Pertinent Review of Systems:  Review of Systems  Constitutional: Negative for malaise/fatigue and weight loss.  Eyes: Negative for blurred vision and photophobia.  Respiratory: Negative for cough and wheezing.   Cardiovascular: Negative for chest pain and palpitations.  Gastrointestinal: Negative for abdominal pain, constipation, diarrhea, nausea and vomiting.  Genitourinary: Negative for frequency and urgency.  Musculoskeletal: Negative for neck pain.  Skin: Negative.   Neurological: Negative for dizziness, tremors, weakness and headaches.  Endo/Heme/Allergies: Negative for polydipsia.  Psychiatric/Behavioral: Negative for depression. The patient is not nervous/anxious.      PAST MEDICAL, FAMILY, AND SOCIAL HISTORY  Past Medical History:  Diagnosis Date  . Febrile seizure (HCC)   . Goiter   .  Gynecomastia, male   . Physical growth delay   . Puberty delay   . Thyroiditis, autoimmune     Family History  Problem Relation Age of Onset  . Diabetes Paternal Grandfather   . Cancer Neg Hx   . Heart disease Neg Hx      Current Outpatient Medications:  .  doxycycline (VIBRA-TABS) 100 MG tablet, Take 1 tablet (100 mg total) by  mouth 2 (two) times daily., Disp: 20 tablet, Rfl: 0 .  ketoconazole (NIZORAL) 2 % cream, Apply 1 application topically daily., Disp: 15 g, Rfl: 0 .  levothyroxine (SYNTHROID) 25 MCG tablet, Take 1 tablet (25 mcg total) by mouth every morning., Disp: 30 tablet, Rfl: 6 .  levothyroxine (SYNTHROID, LEVOTHROID) 25 MCG tablet, TAKE 1 TABLET BY MOUTH IN THE MORNING **PATIENT NEEDS TO SCHEDULE A FOLLOW UP ** (Patient not taking: Reported on 06/27/2018), Disp: 30 tablet, Rfl: 0  Allergies as of 06/27/2018  . (No Known Allergies)     reports that he has never smoked. He has never used smokeless tobacco. He reports that he does not drink alcohol or use drugs. Pediatric History  Patient Guardian Status  . Mother:  Encisco,[name]   Other Topics Concern  . Not on file  Social History Narrative  . Not on file    1. School and Family: He graduated from high school in 2016. 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      3. Primary Care Provider: None  REVIEW OF SYSTEMS: There are no other significant problems involving [name] [name]'s other body systems.   Objective:  Vital Signs:  BP (!) 106/54   Pulse 60   Ht 5' 4.37" (1.635 m)   Wt 110 lb (49.9 kg)   BMI 18.67 kg/m    Ht Readings from Last 3 Encounters:  06/27/18 5' 4.37" (1.635 m)  12/13/17 5' 4.57" (1.64 m)  10/08/16 5\' 4"  (1.626 m)   Wt Readings from Last 3 Encounters:  06/27/18 110 lb (49.9 kg)  12/13/17 113 lb (51.3 kg)  06/14/17 110 lb 3.2 oz (50 kg)   Body surface area is 1.51 meters squared. Facility age limit for growth percentiles is 20 years. Facility age limit for growth percentiles is 20 years.  PHYSICAL EXAM:  General: Well developed, well nourished male in no acute distress.  He is alert and oriented. Covered in paint from work.  Head: Normocephalic, atraumatic.   Eyes:  Pupils equal and round. EOMI.  Sclera white.  No eye drainage.   Ears/Nose/Mouth/Throat: Nares patent, no  nasal drainage.  Normal dentition, mucous membranes moist.  Neck: supple, no cervical lymphadenopathy, + thyroid mildly enlarged. No nodules, non tender.  Cardiovascular: regular rate, normal S1/S2, no murmurs Respiratory: No increased work of breathing.  Lungs clear to auscultation bilaterally.  No wheezes. Abdomen: soft, nontender, nondistended. Normal bowel sounds.  No appreciable masses  Extremities: warm, well perfused, cap refill < 2 sec.   Musculoskeletal: Normal muscle mass.  Normal strength Skin: warm, dry.  No rash or lesions. Neurologic: alert and oriented, normal speech, no tremor    Labs: TFTs today     Assessment and Plan:   ASSESSMENT:  1. Goiter: Thyroid is mildly enlarged, stable.  2. Hypothyroidism, acquired, autoimmune:  He is clinically euthyroid on 25 mcg of Levothyroxine per day. He is due for labs today.      PLAN:  1. Diagnostic: TSH, FT4 ordered  2. Therapeutic: Take 25 mcg of  levothyroxine per day.  3. Patient education: Reviewed growth chart. Discussed importance of healthy diet and adequate calories. Reviewed symptoms of hypothyroidism. Stressed importance of taking Levothyroxine as prescribed and not missing doses. Answered questions.    4. Follow-up: 6 months.   Level of Service:  This visit lasted >15 minutes. More then 50% of the visit was devoted to counseling and education.    Gretchen Short,  FNP-C  Pediatric Specialist  [address]  [place], [address]  Tele: [phone]

## 2018-06-28 ENCOUNTER — Encounter (INDEPENDENT_AMBULATORY_CARE_PROVIDER_SITE_OTHER): Payer: Self-pay | Admitting: *Deleted

## 2018-07-20 ENCOUNTER — Other Ambulatory Visit (INDEPENDENT_AMBULATORY_CARE_PROVIDER_SITE_OTHER): Payer: Self-pay | Admitting: Family

## 2018-07-20 DIAGNOSIS — E063 Autoimmune thyroiditis: Secondary | ICD-10-CM

## 2018-08-26 ENCOUNTER — Other Ambulatory Visit (INDEPENDENT_AMBULATORY_CARE_PROVIDER_SITE_OTHER): Payer: Self-pay | Admitting: Family

## 2018-08-26 DIAGNOSIS — E063 Autoimmune thyroiditis: Secondary | ICD-10-CM

## 2018-12-26 ENCOUNTER — Ambulatory Visit (INDEPENDENT_AMBULATORY_CARE_PROVIDER_SITE_OTHER): Payer: Self-pay | Admitting: Family

## 2019-01-05 ENCOUNTER — Other Ambulatory Visit: Payer: Self-pay

## 2019-01-05 ENCOUNTER — Encounter (INDEPENDENT_AMBULATORY_CARE_PROVIDER_SITE_OTHER): Payer: Self-pay | Admitting: Family

## 2019-01-05 ENCOUNTER — Ambulatory Visit (INDEPENDENT_AMBULATORY_CARE_PROVIDER_SITE_OTHER): Payer: Self-pay | Admitting: Family

## 2019-01-05 VITALS — BP 110/66 | HR 72 | Wt 110.8 lb

## 2019-01-05 DIAGNOSIS — E063 Autoimmune thyroiditis: Secondary | ICD-10-CM

## 2019-01-05 DIAGNOSIS — E049 Nontoxic goiter, unspecified: Secondary | ICD-10-CM

## 2019-01-05 NOTE — Progress Notes (Signed)
This is a Pediatric Specialist E-Visit follow up consult provided via  [employer] [name] [name] consented to an E-Visit consult today.  Location of patient: [name] [name] is at Pediatric Specialist office  Location of provider: Gretchen Short, FNP is at home office  Patient was referred by Christel Mormon, MD   The following participants were involved in this E-Visit:[name] [name], RMA Gretchen Short, FNP [name] [name]- patient  Chief Complain/ Reason for E-Visit today: Hypothyroid follow up  Total time on call: This visit lasted >15 minutes. More then 50% of the visit was devoted to counseling.  Follow up: 6 months.    Subjective:  Patient Name: [name] [name] Date of Birth: 1996/01/23  MRN: [id number]  [name] [name]  presents to the office today for follow-up evaluation and management of his acquired hypothyroidism, goiter, and thyroiditis.   HISTORY OF PRESENT ILLNESS:   [name] [name] is a 23 y.o. Hispanic young man.  [place] was unaccompanied.  1. Dr Fransico Michael saw the patient for his initial consultation on 02/26/10. He had been referred by his nurse practitioner at [facility], [name], for evaluation of growth delay, puberty delay, and gynecomastia. He was 14-5/76 years old.  A. The child had had febrile seizures at about 1 months of age and was treated for two years with some medications, presumably anti-epileptic drugs. He was subsequently noted to have short stature and was evaluated at the [facility] at [facility] in 2009. His screening tests for growth hormone were reportedly normal. Bone age was reportedly read as 68 at a chronologic age of 12-3/12.  Child was supposed to FU at [facility] in 2010 but did not. Gynecomastia was first noted in 2010. FH was positive for the father having pubertal delay and continuing to grow at ages 2-18. Father was reportedly 72 inches tall. Mother was 60 inches tall. Maternal grandfather was tall, but maternal grandmother and both paternal  grandparents were short.    B. On physical exam the child was at about the 6% for height and at about the 8% for weight, increased from the 2% and 1% respectively in 2009. He was short and slender, but proportionate. He had a 20+ gram goiter. Areolae were Tanner stage 1 in configuration, but 19-20 mm in diameter, which was somewhat enlarged. Pubic hair was Tanner III-IV, Testes were 16-20 mL in volume. IGF-1 was 203 (normal for age and increased from 30 in 2009). CMP was normal. TFTs were low-normal. 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  He also had a goiter and low-normal TFTs. I elected to follow him serially over time.   2. During the past six years he has progressed further through puberty and has grown in both height and weight. His goiter size and TFTs have fluctuated over time. When his TFTs in July 2013 were hypothyroid, Dr. Fransico Michael started him on Synthroid, 25 mcg/day.   3. The patient's last PSS[facility] visit was on 05/2018. Since that time no ER visit or hospitalizations.   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 He is limiting his contact with people outside of his family so he does not get COVID. He reports a good appetite and weight being stable. He is taking 25 mcg of levothyroxine per day. No missed doses. Denies fatigue. Constipation and cold in intolerance.     4. Pertinent Review of Systems:  Review of Systems  Constitutional: Negative for malaise/fatigue and weight  loss.  Eyes: Negative for blurred vision and photophobia.  Respiratory: Negative for cough and wheezing.   Cardiovascular: Negative for chest pain and palpitations.  Gastrointestinal: Negative for abdominal pain, constipation, diarrhea, nausea and vomiting.  Genitourinary: Negative for frequency and urgency.  Musculoskeletal: Negative for neck pain.  Skin: Negative.   Neurological: Negative for dizziness, tremors, weakness and  headaches.  Endo/Heme/Allergies: Negative for polydipsia.  Psychiatric/Behavioral: Negative for depression. The patient is not nervous/anxious.      PAST MEDICAL, FAMILY, AND SOCIAL HISTORY  Past Medical History:  Diagnosis Date  . Febrile seizure (HCC)   . Goiter   . Gynecomastia, male   . Physical growth delay   . Puberty delay   . Thyroiditis, autoimmune     Family History  Problem Relation Age of Onset  . Diabetes Paternal Grandfather   . Cancer Neg Hx   . Heart disease Neg Hx      Current Outpatient Medications:  .  doxycycline (VIBRA-TABS) 100 MG tablet, Take 1 tablet (100 mg total) by mouth 2 (two) times daily., Disp: 20 tablet, Rfl: 0 .  ketoconazole (NIZORAL) 2 % cream, Apply 1 application topically daily., Disp: 15 g, Rfl: 0 .  levothyroxine (SYNTHROID) 25 MCG tablet, Take 1 tablet (25 mcg total) by mouth every morning., Disp: 30 tablet, Rfl: 6 .  levothyroxine (SYNTHROID, LEVOTHROID) 25 MCG tablet, TAKE 1 TABLET BY MOUTH ONCE DAILY IN THE MORNING **PATEINT  NEEDS  TO  SCHEDULE  A  FOLLOW  UP**, Disp: 30 tablet, Rfl: 5  Allergies as of 01/05/2019  . (No Known Allergies)     reports that he has never smoked. He has never used smokeless tobacco. He reports that he does not drink alcohol or use drugs. Pediatric History  Patient Parents  . Encisco,[name] (Mother)   Other Topics Concern  . Not on file  Social History Narrative  . Not on file    1. School and Family: He graduated from high school in 2016. 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      3. Primary Care Provider: None  REVIEW OF SYSTEMS: There are no other significant problems involving [name] [name]'s other body systems.   Objective:  Vital Signs:  BP 110/66   Pulse 72   Wt 110 lb 12.8 oz (50.3 kg)   BMI 18.80 kg/m    Ht Readings from Last 3 Encounters:  06/27/18 5' 4.37" (1.635 m)  12/13/17 5' 4.57" (1.64 m)  10/08/16 5\' 4"  (1.626 m)   Wt Readings  from Last 3 Encounters:  01/05/19 110 lb 12.8 oz (50.3 kg)  06/27/18 110 lb (49.9 kg)  12/13/17 113 lb (51.3 kg)   Body surface area is 1.51 meters squared. Facility age limit for growth percentiles is 20 years. Facility age limit for growth percentiles is 20 years.  PHYSICAL EXAM:  General: Well developed, well nourished male in no acute distress.  Alert and oriented.  Head: Normocephalic, atraumatic.   Eyes:  Pupils equal and round. EOMI.  Sclera white.  No eye drainage.   Ears/Nose/Mouth/Throat: Nares patent, no nasal drainage.  Normal dentition, mucous membranes moist.  Neck: supple, + thyromegaly Cardiovascular: No cyanosis.  Respiratory: No increased work of breathing.   Skin: warm, dry.  No rash or lesions. Neurologic: alert and oriented, normal speech, no tremor    Labs: TFTs today     Assessment and Plan:   ASSESSMENT:  1. Goiter: Thyroid is mildly enlarged,  stable and consistentt with hx of goiter  2. Hypothyroidism, acquired, autoimmune:  He is clinically euthyroid on 25 mcg of Levothyroxine per day. He is due for labs today.      PLAN:  1. Diagnostic: TSH and FT4 ordered.  2. Therapeutic: Take 25 mcg of levothyroxine per day.  3. Patient education:   Reviewed signs and symptoms of hypothyroidsim. Advised that if he misses a dose of levothyroxine he can double the following day. Answered questions.  4. Follow-up: 6 months.      Gretchen ShortSpenser Kathyrn Warmuth,  FNP-C  Pediatric Specialist  [address]  [place], [id number]  Tele: [phone]

## 2019-01-05 NOTE — Patient Instructions (Signed)
6 month follow up

## 2019-03-21 ENCOUNTER — Other Ambulatory Visit (INDEPENDENT_AMBULATORY_CARE_PROVIDER_SITE_OTHER): Payer: Self-pay | Admitting: *Deleted

## 2019-03-21 ENCOUNTER — Other Ambulatory Visit (INDEPENDENT_AMBULATORY_CARE_PROVIDER_SITE_OTHER): Payer: Self-pay | Admitting: Family

## 2019-03-21 DIAGNOSIS — E063 Autoimmune thyroiditis: Secondary | ICD-10-CM

## 2019-03-21 MED ORDER — LEVOTHYROXINE SODIUM 25 MCG PO TABS: ORAL_TABLET | ORAL | 5 refills | Status: DC

## 2019-06-22 ENCOUNTER — Other Ambulatory Visit: Payer: Self-pay

## 2019-06-22 ENCOUNTER — Encounter: Payer: Self-pay | Admitting: Family Medicine

## 2019-06-22 ENCOUNTER — Ambulatory Visit (INDEPENDENT_AMBULATORY_CARE_PROVIDER_SITE_OTHER): Payer: Self-pay | Admitting: Family Medicine

## 2019-06-22 DIAGNOSIS — L0231 Cutaneous abscess of buttock: Secondary | ICD-10-CM

## 2019-06-22 MED ORDER — DOXYCYCLINE HYCLATE 100 MG PO TABS: 100.0000 mg | ORAL_TABLET | Freq: Two times a day (BID) | ORAL | 0 refills | Status: DC

## 2019-06-22 NOTE — Patient Instructions (Signed)
If it gets a draining, try to squeeze out what you can.  Take doxycycline 100 mg 1 twice daily for 2 weeks  Return in 2 weeks to get it ready checked.  If it is not improving we may need to refer you onto a general surgeon because it may have multiple pockets of pus in the old scarred area.  Return sooner if problems.

## 2019-06-22 NOTE — Progress Notes (Signed)
Patient ID: [name] [name], male    DOB: 1996-06-07  Age: 23 y.o. MRN: [id number]  Chief Complaint  Patient presents with  . Cyst    pt stated have blister on the Rt buttocks--brown drainage, sore, itching--started last week    Subjective:   23 year old male who comes in with a abscess on his buttock.  He was here almost 3 years ago with it and was prescribed doxycycline which he never got filled.  It is intermittently festered up and drained over the several years.  He does not describe it being particularly worse right now.  Apparently sometimes when he is sitting on the toilet bearing down a little bit squeezes out of it, sometimes bloody.  Current allergies, medications, problem list, past/family and social histories reviewed.  Objective:  There were no vitals taken for this visit.  No major acute distress.  On the right buttock a couple of inches out from his anus there is a granulomatous nodule about 0.8 cm in diameter.  This is not draining and is not fleshy appearing.  There is a poor area just to the right of the nodule, which does not express anything out right this minute.  He has a couple of areas of scarring superior to to that main lesion, where apparently it is drained in the past.  The whole area is a little firm to palpation, not fluctuant.  It feels like a scarred nodular area.  Assessment & Plan:   Assessment: 1. Abscess of buttock, right       Plan: This does not seem to need an I&D right now, but is probably an old multiloculated area.  I am going to treat with antibiotics and see how he responds.  If it continues to be a problem probably should be cut by a surgeon because it may need to have multiple pockets drained.  No orders of the defined types were placed in this encounter.   Meds ordered this encounter  Medications  . doxycycline (VIBRA-TABS) 100 MG tablet    Sig: Take 1 tablet (100 mg total) by mouth 2 (two) times daily.    Dispense:  28 tablet   Refill:  0         Patient Instructions  If it gets a draining, try to squeeze out what you can.  Take doxycycline 100 mg 1 twice daily for 2 weeks  Return in 2 weeks to get it ready checked.  If it is not improving we may need to refer you onto a general surgeon because it may have multiple pockets of pus in the old scarred area.  Return sooner if problems.    Return in about 2 weeks (around 07/06/2019).

## 2019-07-06 ENCOUNTER — Ambulatory Visit: Payer: Medicaid Other | Admitting: Registered Nurse

## 2019-07-09 ENCOUNTER — Other Ambulatory Visit: Payer: Self-pay

## 2019-07-09 ENCOUNTER — Encounter (INDEPENDENT_AMBULATORY_CARE_PROVIDER_SITE_OTHER): Payer: Self-pay | Admitting: Family

## 2019-07-09 ENCOUNTER — Ambulatory Visit (INDEPENDENT_AMBULATORY_CARE_PROVIDER_SITE_OTHER): Payer: Self-pay | Admitting: Family

## 2019-07-09 VITALS — BP 118/62 | HR 64 | Wt 113.8 lb

## 2019-07-09 DIAGNOSIS — E049 Nontoxic goiter, unspecified: Secondary | ICD-10-CM

## 2019-07-09 DIAGNOSIS — E063 Autoimmune thyroiditis: Secondary | ICD-10-CM

## 2019-07-09 NOTE — Patient Instructions (Signed)
-  Signs of hypothyroidism (underactive thyroid) include increased sleep, sluggishness, weight gain, and constipation. -Signs of hyperthyroidism (overactive thyroid) include difficulty sleeping, diarrhea, heart racing, weight loss, or irritability  Please let me know if you develop any of these symptoms so we can repeat your thyroid tests.  

## 2019-07-09 NOTE — Progress Notes (Signed)
Subjective:  Patient Name: [name] [name] Date of Birth: [birth year]  MRN: [id number]  [name] [name]  presents to the office today for follow-up evaluation and management of his acquired hypothyroidism, goiter, and thyroiditis.   HISTORY OF PRESENT ILLNESS:   [name] [name] is a 23 y.o. Hispanic young man.  [name] [name] was unaccompanied.  1. Dr [name] [name] saw the patient for his initial consultation on 02/26/10. He had been referred by his nurse practitioner at [facility] Ms. Maudry Mayhew, for evaluation of growth delay, puberty delay, and gynecomastia. He was 14-5/49 years old.  A. The child had had febrile seizures at about 46 months of age and was treated for two years with some medications, presumably anti-epileptic drugs. He was subsequently noted to have short stature and was evaluated at the [facility] in 2009. His screening tests for growth hormone were reportedly normal. Bone age was reportedly read as 74 at a chronologic age of 12-3/12.  Child was supposed to FU at [facility] in 2010 but did not. Gynecomastia was first noted in 2010. FH was positive for the father having pubertal delay and continuing to grow at ages 48-18. Father was reportedly 72 inches tall. Mother was 60 inches tall. Maternal grandfather was tall, but maternal grandmother and both paternal grandparents were short.    B. On physical exam the child was at about the 6% for height and at about the 8% for weight, increased from the 2% and 1% respectively in 2009. He was short and slender, but proportionate. He had a 20+ gram goiter. Areolae were Tanner stage 1 in configuration, but 19-20 mm in diameter, which was somewhat enlarged. Pubic hair was Tanner III-IV, Testes were 16-20 mL in volume. IGF-1 was 203 (normal for age and increased from 38 in 2009). CMP was normal. TFTs were low-normal. 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  He also  had a goiter and low-normal TFTs. I elected to follow him serially over time.   2. During the past six years he has progressed further through puberty and has grown in both height and weight. His goiter size and TFTs have fluctuated over time. When his TFTs in July 2013 were hypothyroid, Dr. Tobe [name] started him on Synthroid, 25 mcg/day.   3. The patient's last [facility] visit was on 05/2018. Since that time no ER visit or hospitalizations.   He has been busy with work, mainly doing painting. He reports that he is taking 25 mcg of levothyroxine per day, denies missed doses. He denies fatigue, constipation and cold intolerance.   4. Pertinent Review of Systems:  Review of Systems  Constitutional: Negative for malaise/fatigue and weight loss.  Eyes: Negative for blurred vision and photophobia.  Respiratory: Negative for cough and wheezing.   Cardiovascular: Negative for chest pain and palpitations.  Gastrointestinal: Negative for abdominal pain, constipation, diarrhea, nausea and vomiting.  Genitourinary: Negative for frequency and urgency.  Musculoskeletal: Negative for neck pain.  Skin: Negative.   Neurological: Negative for dizziness, tremors, weakness and headaches.  Endo/Heme/Allergies: Negative for polydipsia.  Psychiatric/Behavioral: Negative for depression. The patient is not nervous/anxious.      PAST MEDICAL, FAMILY, AND SOCIAL HISTORY  Past Medical History:  Diagnosis Date  . Febrile seizure ([place])   . Goiter   . Gynecomastia, male   . Physical growth delay   . Puberty delay   . Thyroiditis, autoimmune     Family History  Problem  Relation Age of Onset  . Diabetes Paternal Grandfather   . Cancer Neg Hx   . Heart disease Neg Hx      Current Outpatient Medications:  .  doxycycline (VIBRA-TABS) 100 MG tablet, Take 1 tablet (100 mg total) by mouth 2 (two) times daily., Disp: 28 tablet, Rfl: 0 .  ketoconazole (NIZORAL) 2 % cream, Apply 1 application topically daily., Disp:  15 g, Rfl: 0 .  levothyroxine (SYNTHROID) 25 MCG tablet, TAKE 1 TABLET BY MOUTH ONCE DAILY IN THE MORNING, Disp: 30 tablet, Rfl: 5  Allergies as of 07/09/2019  . (No Known Allergies)     reports that he has never smoked. He has never used smokeless tobacco. He reports that he does not drink alcohol or use drugs. Pediatric History  Patient Parents  . [place],[name] (Mother)   Other Topics Concern  . Not on file  Social History Narrative  . Not on file    1. School and Family: He graduated from high school in 2016. 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      3. Primary Care Provider: None  REVIEW OF SYSTEMS: There are no other significant problems involving Brach's other body systems.   Objective:  Vital Signs:  BP 118/62   Pulse 64   Wt 113 lb 12.8 oz (51.6 kg)   BMI 19.31 kg/m    Ht Readings from Last 3 Encounters:  06/27/18 5' 4.37" (1.635 m)  12/13/17 5' 4.57" (1.64 m)  10/08/16 5\' 4"  (1.626 m)   Wt Readings from Last 3 Encounters:  07/09/19 113 lb 12.8 oz (51.6 kg)  01/05/19 110 lb 12.8 oz (50.3 kg)  06/27/18 110 lb (49.9 kg)   Body surface area is 1.53 meters squared. Facility age limit for growth percentiles is 20 years. Facility age limit for growth percentiles is 20 years.  PHYSICAL EXAM:  General: Well developed, well nourished male in no acute distress.  Alert and oriented.  Head: Normocephalic, atraumatic.   Eyes:  Pupils equal and round. EOMI.  Sclera white.  No eye drainage.   Ears/Nose/Mouth/Throat: Nares patent, no nasal drainage.  Normal dentition, mucous membranes moist.  Neck: supple, no cervical lymphadenopathy, + thyromegaly bilaterally. No tenderness. No nodules.  Cardiovascular: regular rate, normal S1/S2, no murmurs Respiratory: No increased work of breathing.  Lungs clear to auscultation bilaterally.  No wheezes. Abdomen: soft, nontender, nondistended. Normal bowel sounds.  No appreciable masses   Extremities: warm, well perfused, cap refill < 2 sec.   Musculoskeletal: Normal muscle mass.  Normal strength Skin: warm, dry.  No rash or lesions. Neurologic: alert and oriented, normal speech, no tremor     Labs: TFTs today     Assessment and Plan:   ASSESSMENT:  1. Goiter: Thyroid is mildly enlarged, stable and consistentt with hx of goiter  2. Hypothyroidism, acquired, autoimmune: Clinically euthyroid on 25 mcg of levothyroxine per day. Labs due today.      PLAN:  1. Diagnostic: TSH and FT4 ordered  2. Therapeutic: Take 25 mcg of levothyroxine per day.  3. Patient education:   Reviewed signs and symptoms of hypothyroidsim. Discussed transition to adult endocrinology. Answered questions.  4. Follow-up: Adult endocrine within 6 months.    LOS: this visit lasted >15 minutes. More then 50% of the visit was devoted to counseling.   06/29/18,  FNP-C  Pediatric Specialist  [address]  [place], [place]  Tele: [phone]

## 2019-07-10 ENCOUNTER — Encounter: Payer: Self-pay | Admitting: Registered Nurse

## 2019-07-10 ENCOUNTER — Telehealth (INDEPENDENT_AMBULATORY_CARE_PROVIDER_SITE_OTHER): Payer: Self-pay | Admitting: *Deleted

## 2019-07-10 ENCOUNTER — Other Ambulatory Visit (INDEPENDENT_AMBULATORY_CARE_PROVIDER_SITE_OTHER): Payer: Self-pay | Admitting: *Deleted

## 2019-07-10 DIAGNOSIS — E063 Autoimmune thyroiditis: Secondary | ICD-10-CM

## 2019-07-10 LAB — T4, FREE: Free T4: 1.2 ng/dL (ref 0.8–1.8)

## 2019-07-10 LAB — TSH: TSH: 1.17 mIU/L (ref 0.40–4.50)

## 2019-07-10 NOTE — Telephone Encounter (Signed)
Spoke to patient advised that per [facility]:    Thyroid labs are normal. Continue current dose of levothyroxine. Transition to adult care. Referral sent to [facility].

## 2019-09-30 ENCOUNTER — Other Ambulatory Visit (INDEPENDENT_AMBULATORY_CARE_PROVIDER_SITE_OTHER): Payer: Self-pay | Admitting: Family

## 2019-09-30 DIAGNOSIS — E063 Autoimmune thyroiditis: Secondary | ICD-10-CM

## 2019-10-01 ENCOUNTER — Other Ambulatory Visit (INDEPENDENT_AMBULATORY_CARE_PROVIDER_SITE_OTHER): Payer: Self-pay | Admitting: *Deleted

## 2019-10-01 ENCOUNTER — Encounter: Payer: Self-pay | Admitting: Endocrinology

## 2019-10-01 DIAGNOSIS — E063 Autoimmune thyroiditis: Secondary | ICD-10-CM

## 2019-10-01 MED ORDER — LEVOTHYROXINE SODIUM 25 MCG PO TABS: ORAL_TABLET | ORAL | 5 refills | Status: AC

## 2020-01-10 ENCOUNTER — Other Ambulatory Visit: Payer: Self-pay

## 2020-01-10 ENCOUNTER — Ambulatory Visit (INDEPENDENT_AMBULATORY_CARE_PROVIDER_SITE_OTHER): Payer: Self-pay | Admitting: Family Medicine

## 2020-01-10 ENCOUNTER — Encounter: Payer: Self-pay | Admitting: Family Medicine

## 2020-01-10 VITALS — BP 118/70 | HR 83 | Temp 97.8°F | Resp 15 | Ht 64.0 in | Wt 118.6 lb

## 2020-01-10 DIAGNOSIS — L0231 Cutaneous abscess of buttock: Secondary | ICD-10-CM

## 2020-01-10 MED ORDER — SULFAMETHOXAZOLE-TRIMETHOPRIM 800-160 MG PO TABS: 1.0000 | ORAL_TABLET | Freq: Two times a day (BID) | ORAL | 0 refills | Status: AC

## 2020-01-10 NOTE — Progress Notes (Signed)
Patient ID: [name] [name], male    DOB: 1996-08-11  Age: 24 y.o. MRN: [id number]  Chief Complaint  Patient presents with  . Cyst    pt has had a cyst on his buttox for 4 years now, complains it is getting worse and hes becoming more uncomfortable, pt would like it removed, he is requesting referral to dermatology. 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  I saw him for this about 6 months ago and treated with antibiotics.  He was to return for referral to surgery if it was not much better.  Apparently his job took him to working down in [facility] so he never came back in here.  It improves a little bit on the antibiotics, then got worse.  He now has an abscess developing on the left buttock also.  One of the right drains a lot.   Current allergies, medications, problem list, past/family and social histories reviewed.  Objective:  BP 118/70   Pulse 83   Temp 97.8 F (36.6 C) (Temporal)   Resp 15   Ht 5\' 4"  (1.626 m)   Wt 118 lb 9.6 oz (53.8 kg)   SpO2 97%   BMI 20.36 kg/m   No major acute distress.  He has a draining nodular area to the right of his anus on on the medial aspect of the buttock cheek a few centimeters away from the actual opening of the anus.  There is a lot of of scattered induration and scar tissue from what has been a chronic infection.  On the left buttock, a little above the other area, is a slightly erythematous and slightly more tender indurated area with a small amount of central fluctuance. Assessment & Plan:   Assessment: 1. Abscess of buttock, right   2. Abscess of left buttock       Plan: This will need surgical drainage and packing almost certainly, as it appears to be a multiloculated area that will take some time to try and heal up.  [facility] and [facility] will contact him to try and set up an arrangement.  I hope we can get him in before the weekend.  Orders Placed This Encounter  Procedures  . WOUND CULTURE    Order Specific Question:   Source    Answer:   right buttock abscess  . Ambulatory referral to General Surgery    Referral Priority:   Routine    Referral Type:   Surgical    Referral Reason:   Specialty Services Required    Requested Specialty:   General Surgery    Number of Visits Requested:   1    Meds ordered this encounter  Medications  . sulfamethoxazole-trimethoprim (BACTRIM DS) 800-160 MG tablet    Sig: Take 1 tablet by mouth 2 (two) times daily.    Dispense:  20 tablet    Refill:  0         Patient Instructions    I spoke with [facility] and gave them your number, they are to get back to you to make the arrangements for the visit.  If you should not hear from them, the number of call them out was [phone] and you should call and tell them that  Dr. Ruben Reason spoke to someone at that office who was supposed to call you back to set up the appointment. Explained to them that you have abscesses on both buttocks near the rectum that will need to be drained.  I have called in for you sulfamethoxazole antibiotic to take 1 pill twice daily with breakfast and with supper for infection to start trying to calm things down a little more before they work on you.  You can take acetaminophen (Tylenol) 500 mg 2 pills 3 times daily if needed for pain or ibuprofen 200 mg 3 pills 3 times daily with food for pain.  Return if necessary.   If you have lab work done today you will be contacted with your lab results within the next 2 weeks.  If you have not heard from [place] then please contact us. The fastest way to get your results is to register for My Chart.   IF you received an x-ray today, you will receive an invoice from [facility]. Please contact [facility] at  [phone] with questions or concerns regarding your invoice.   IF you received labwork today, you will receive an invoice from [place]. Please contact [facility] at [phone] with questions or concerns regarding your invoice.   Our billing staff will not be able to assist you with questions regarding bills from these companies.  You will be contacted with the lab results as soon as they are available. The fastest way to get your results is to activate your My Chart account. Instructions are located on the last page of this paperwork. If you have not heard from [place] regarding the results in 2 weeks, please contact this office.        Return if symptoms worsen or fail to improve.   Ruben Reason, MD 01/10/2020

## 2020-01-10 NOTE — Patient Instructions (Addendum)
  I spoke with [facility] and gave them your number, they are to get back to you to make the arrangements for the visit.  If you should not hear from them, the number of call them out was [phone] and you should call and tell them that Dr. Janace Hoard spoke to someone at that office who was supposed to call you back to set up the appointment. Explained to them that you have abscesses on both buttocks near the rectum that will need to be drained.  I have called in for you sulfamethoxazole antibiotic to take 1 pill twice daily with breakfast and with supper for infection to start trying to calm things down a little more before they work on you.  You can take acetaminophen (Tylenol) 500 mg 2 pills 3 times daily if needed for pain or ibuprofen 200 mg 3 pills 3 times daily with food for pain.  Return if necessary.   If you have lab work done today you will be contacted with your lab results within the next 2 weeks.  If you have not heard from [place] then please contact us. The fastest way to get your results is to register for My Chart.   IF you received an x-ray today, you will receive an invoice from [facility]. Please contact [facility] at [phone] with questions or concerns regarding your invoice.   IF you received labwork today, you will receive an invoice from [place]. Please contact [facility] at [phone] with questions or concerns regarding your invoice.   Our billing staff will not be able to assist you with questions regarding bills from these companies.  You will be contacted with the lab results as soon as they are available. The fastest way to get your results is to activate your My Chart account. Instructions are located on the last page of this paperwork. If you have not heard from [place] regarding the results in 2 weeks, please contact this office.

## 2020-01-13 LAB — WOUND CULTURE

## 2020-01-15 ENCOUNTER — Ambulatory Visit: Payer: Self-pay | Admitting: General Surgery

## 2020-01-15 NOTE — H&P (Signed)
The patient is a 24 year old male who presents with a subcutaneous abscess. 24 year old male who presents to the office for evaluation of a perirectal abscess. He states that he has had a draining sinus on his right buttock for the past 4 years. A couple weeks ago, he had noticed increased pain on the left side and was diagnosed with a perirectal abscess. This was drained in the office. He was then scheduled for follow-up to discuss surgery this week. He reports that his symptoms are much better. He denies any fevers, but he is having pain with bowel movements. He denies any rectal bleeding. He reports regular bowel habits and denies chronic diarrhea. His weight has been stable.   Problem List/Past Medical Leighton Ruff, MD; 7/62/8315 12:15 PM) PERIANAL ABSCESS (K61.0) ANAL FISTULA (K60.3)  Past Surgical History Leighton Ruff, MD; 1/76/1607 12:15 PM) No pertinent past surgical history  Diagnostic Studies History Leighton Ruff, MD; 3/71/0626 12:15 PM) Colonoscopy never  Allergies Sabino Gasser, CMA; 01/15/2020 11:57 AM) No Known Drug Allergies [01/10/2020]: Allergies Reconciled  Medication History Sabino Gasser, CMA; 01/15/2020 11:57 AM) Amoxicillin-Pot Clavulanate (875-125MG  Tablet, 1 (one) Oral twice a day, Taken starting 01/10/2020) Active. Levothyroxine Sodium (25MCG Tablet, Oral) Active. Medications Reconciled  Social History Leighton Ruff, MD; 9/48/5462 12:15 PM) Alcohol use Occasional alcohol use. Caffeine use Carbonated beverages, Coffee, Tea. No drug use Tobacco use Never smoker.  Family History Leighton Ruff, MD; 03/01/5008 12:15 PM) Thyroid problems Sister.  Other Problems Leighton Ruff, MD; 3/81/8299 12:15 PM) Thyroid Disease     Review of Systems Leighton Ruff MD; 3/71/6967 12:16 PM) General Not Present- Appetite Loss, Chills, Fatigue, Fever, Night Sweats, Weight Gain and Weight Loss. Skin Present- Dryness. Not Present- Change in  Wart/Mole, Hives, Jaundice, New Lesions, Non-Healing Wounds, Rash and Ulcer. HEENT Not Present- Earache, Hearing Loss, Hoarseness, Nose Bleed, Oral Ulcers, Ringing in the Ears, Seasonal Allergies, Sinus Pain, Sore Throat, Visual Disturbances, Wears glasses/contact lenses and Yellow Eyes. Respiratory Not Present- Bloody sputum, Chronic Cough, Difficulty Breathing, Snoring and Wheezing. Breast Not Present- Breast Mass, Breast Pain, Nipple Discharge and Skin Changes. Cardiovascular Not Present- Chest Pain, Difficulty Breathing Lying Down, Leg Cramps, Palpitations, Rapid Heart Rate, Shortness of Breath and Swelling of Extremities. Gastrointestinal Not Present- Abdominal Pain, Bloating, Bloody Stool, Change in Bowel Habits, Chronic diarrhea, Constipation, Difficulty Swallowing, Excessive gas, Gets full quickly at meals, Hemorrhoids, Indigestion, Nausea, Rectal Pain and Vomiting. Male Genitourinary Not Present- Blood in Urine, Change in Urinary Stream, Frequency, Impotence, Nocturia, Painful Urination, Urgency and Urine Leakage. Musculoskeletal Not Present- Back Pain, Joint Pain, Joint Stiffness, Muscle Pain, Muscle Weakness and Swelling of Extremities. Neurological Not Present- Decreased Memory, Fainting, Headaches, Numbness, Seizures, Tingling, Tremor, Trouble walking and Weakness. Psychiatric Not Present- Anxiety, Bipolar, Change in Sleep Pattern, Depression, Fearful and Frequent crying. Endocrine Not Present- Cold Intolerance, Excessive Hunger, Hair Changes, Heat Intolerance and New Diabetes. Hematology Not Present- Blood Thinners, Easy Bruising, Excessive bleeding, Gland problems, HIV and Persistent Infections.  Vitals Sabino Gasser CMA; 01/15/2020 11:58 AM) 01/15/2020 11:57 AM Weight: 119 lb Height: 64in Body Surface Area: 1.57 m Body Mass Index: 20.43 kg/m  Temp.: 97.9F(Tympanic)  Pulse: 97 (Regular)  BP: 110/72(Sitting, Left Arm, Standard)        Physical Exam Leighton Ruff MD; 8/93/8101 12:16 PM)  The physical exam findings are as follows: Note:GENERAL: Well-developed, well nourished male in no acute distress Wearing mask  RESPIRATORY: Normal effort, no use of accessory muscles  MUSCULOSKELETAL: Normal gait Grossly normal ROM upper  extremities Grossly normal ROM lower extremities  SKIN: Warm and dry Not diaphoretic  PSYCHIATRIC: Normal judgement and insight Normal mood and affect Alert, oriented x 3  Rectal Note: Right buttock: There is a 2 cm raised, nodular, lesion about 1 cm from the anal verge. There is a moderate amount of firmness/chronic scarring in this area. When pressing on the surrounding tissues, a small amount of purulent drainage evacuates from an opening on the lesion. There is no cellulitis or fluctuance.  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 He does not appear to have Crohn's disease. I have recommended an anal exam under anesthesia with possible fistulotomy and/or seton placement. We have discussed the implications for doing both of these procedures. We have discussed that he may need a second operation to treat his fistula. We discussed the risks of incontinence with fistulotomy. We discussed the typical postoperative pain and recovery times of both surgeries. All questions were answered.

## 2020-05-28 ENCOUNTER — Other Ambulatory Visit (INDEPENDENT_AMBULATORY_CARE_PROVIDER_SITE_OTHER): Payer: Self-pay | Admitting: Family

## 2020-05-28 DIAGNOSIS — E063 Autoimmune thyroiditis: Secondary | ICD-10-CM

## 2020-05-29 ENCOUNTER — Other Ambulatory Visit (INDEPENDENT_AMBULATORY_CARE_PROVIDER_SITE_OTHER): Payer: Self-pay | Admitting: Family

## 2020-05-29 DIAGNOSIS — E063 Autoimmune thyroiditis: Secondary | ICD-10-CM
